# Patient Record
Sex: Male | Born: 1970
Health system: Southern US, Community
[De-identification: ages and names within clinical notes are randomized; demographics above are authoritative.]

## PROBLEM LIST (undated history)

## (undated) ENCOUNTER — Emergency Department (HOSPITAL_COMMUNITY): Discharge status: Absent without leave | Payer: MEDICAID

## (undated) DIAGNOSIS — F329 Major depressive disorder, single episode, unspecified: Secondary | ICD-10-CM

## (undated) DIAGNOSIS — F1010 Alcohol abuse, uncomplicated: Secondary | ICD-10-CM

## (undated) DIAGNOSIS — I10 Essential (primary) hypertension: Secondary | ICD-10-CM

## (undated) DIAGNOSIS — F32A Depression, unspecified: Secondary | ICD-10-CM

## (undated) HISTORY — PX: TRACHEOSTOMY: SUR1362

---

## 2006-04-29 ENCOUNTER — Encounter: Admission: RE | Admit: 2006-04-29 | Discharge: 2006-04-29 | Payer: Self-pay | Admitting: Specialist

## 2008-08-15 ENCOUNTER — Emergency Department (HOSPITAL_COMMUNITY): Admission: EM | Admit: 2008-08-15 | Discharge: 2008-08-15 | Payer: Self-pay | Admitting: Emergency Medicine

## 2016-01-23 ENCOUNTER — Encounter (HOSPITAL_COMMUNITY): Payer: Self-pay

## 2016-01-23 ENCOUNTER — Inpatient Hospital Stay (HOSPITAL_COMMUNITY)
Admission: EM | Admit: 2016-01-23 | Discharge: 2016-01-28 | DRG: 915 | Disposition: A | Discharge status: Home / Self Care | Payer: Self-pay | Attending: Family Medicine | Admitting: Family Medicine

## 2016-01-23 DIAGNOSIS — G9340 Encephalopathy, unspecified: Secondary | ICD-10-CM | POA: Diagnosis present

## 2016-01-23 DIAGNOSIS — T4275XA Adverse effect of unspecified antiepileptic and sedative-hypnotic drugs, initial encounter: Secondary | ICD-10-CM | POA: Diagnosis present

## 2016-01-23 DIAGNOSIS — Z7982 Long term (current) use of aspirin: Secondary | ICD-10-CM

## 2016-01-23 DIAGNOSIS — K5900 Constipation, unspecified: Secondary | ICD-10-CM | POA: Diagnosis not present

## 2016-01-23 DIAGNOSIS — E876 Hypokalemia: Secondary | ICD-10-CM

## 2016-01-23 DIAGNOSIS — Z978 Presence of other specified devices: Secondary | ICD-10-CM

## 2016-01-23 DIAGNOSIS — I10 Essential (primary) hypertension: Secondary | ICD-10-CM | POA: Diagnosis present

## 2016-01-23 DIAGNOSIS — F1020 Alcohol dependence, uncomplicated: Secondary | ICD-10-CM | POA: Diagnosis present

## 2016-01-23 DIAGNOSIS — F17200 Nicotine dependence, unspecified, uncomplicated: Secondary | ICD-10-CM | POA: Diagnosis present

## 2016-01-23 DIAGNOSIS — R339 Retention of urine, unspecified: Secondary | ICD-10-CM | POA: Diagnosis not present

## 2016-01-23 DIAGNOSIS — T464X5A Adverse effect of angiotensin-converting-enzyme inhibitors, initial encounter: Secondary | ICD-10-CM | POA: Diagnosis present

## 2016-01-23 DIAGNOSIS — T783XXA Angioneurotic edema, initial encounter: Principal | ICD-10-CM

## 2016-01-23 DIAGNOSIS — R1310 Dysphagia, unspecified: Secondary | ICD-10-CM | POA: Diagnosis not present

## 2016-01-23 DIAGNOSIS — Z79899 Other long term (current) drug therapy: Secondary | ICD-10-CM

## 2016-01-23 HISTORY — DX: Essential (primary) hypertension: I10

## 2016-01-23 LAB — BASIC METABOLIC PANEL
ANION GAP: 10 (ref 5–15)
BUN: 11 mg/dL (ref 6–20)
CALCIUM: 8.9 mg/dL (ref 8.9–10.3)
CHLORIDE: 109 mmol/L (ref 101–111)
CO2: 24 mmol/L (ref 22–32)
Creatinine, Ser: 1.03 mg/dL (ref 0.61–1.24)
GFR calc non Af Amer: >60 mL/min (ref >60)
Glucose, Bld: 92 mg/dL (ref 65–99)
POTASSIUM: 3.2 mmol/L — AB (ref 3.5–5.1)
Sodium: 143 mmol/L (ref 135–145)

## 2016-01-23 LAB — CBC
HEMATOCRIT: 44.1 % (ref 39.0–52.0)
HEMOGLOBIN: 14.6 g/dL (ref 13.0–17.0)
MCH: 31.2 pg (ref 26.0–34.0)
MCHC: 33.1 g/dL (ref 30.0–36.0)
MCV: 94.2 fL (ref 78.0–100.0)
Platelets: 173 10*3/uL (ref 150–400)
RBC: 4.68 MIL/uL (ref 4.22–5.81)
RDW: 13.1 % (ref 11.5–15.5)
WBC: 8.5 10*3/uL (ref 4.0–10.5)

## 2016-01-23 MED ORDER — DIPHENHYDRAMINE HCL 50 MG/ML IJ SOLN
50.0000 mg | Freq: Once | INTRAMUSCULAR | Status: AC
  Administered 2016-01-23: 50 mg via INTRAVENOUS
  Filled 2016-01-23: qty 1

## 2016-01-23 MED ORDER — LIDOCAINE HCL 2 % EX GEL: 1.0000 "application " | Freq: Once | CUTANEOUS | Status: DC

## 2016-01-23 MED ORDER — FAMOTIDINE IN NACL 20-0.9 MG/50ML-% IV SOLN
20.0000 mg | Freq: Once | INTRAVENOUS | Status: AC
  Administered 2016-01-23: 20 mg via INTRAVENOUS
  Filled 2016-01-23: qty 50

## 2016-01-23 MED ORDER — METHYLPREDNISOLONE SODIUM SUCC 125 MG IJ SOLR
125.0000 mg | Freq: Once | INTRAMUSCULAR | Status: AC
  Administered 2016-01-23: 125 mg via INTRAVENOUS
  Filled 2016-01-23: qty 2

## 2016-01-23 NOTE — ED Notes (Signed)
Pt states he woke up with left sided facial swelling today. Upon arrival pt complaining of "something in my throat" and diffuse upper lip swelling. No audible stridor.

## 2016-01-23 NOTE — ED Provider Notes (Signed)
CSN: [id number]     Arrival date & time 01/23/16  2208 History   First MD Initiated Contact with Patient 01/23/16 2237     Chief Complaint  Patient presents with  . Angioedema     (Consider location/radiation/quality/duration/timing/severity/associated sxs/prior Treatment) HPI   45 year old male with past medical history of hypertension and chronic alcohol dependence who presents with diffuse lip edema as well as tongue swelling and difficulty swallowing for the last several hours. The patient is taking lisinopril but has been taking it for some time. He states that he noticed mild lip edema earlier this afternoon which has progressively worsened. Over the last 3 hours, he has developed worsening difficulty swallowing and feels like he has spit building up in the back of his throat. Denies any shortness of breath with the exception of when he attempts to swallow. Denies any new drugs or allergen exposures. No history of anaphylaxis. Denies any rash, wheezing, nausea, or vomiting.   Past Medical History  Diagnosis Date  . Hypertension    History reviewed. No pertinent past surgical history. History reviewed. No pertinent family history. Social History  Substance Use Topics  . Smoking status: Current Every Day Smoker  . Smokeless tobacco: None  . Alcohol Use: Yes    Review of Systems  Constitutional: Negative for fever, chills and fatigue.  HENT: Positive for drooling, facial swelling, sore throat, trouble swallowing and voice change. Negative for congestion and rhinorrhea.   Eyes: Negative for visual disturbance.  Respiratory: Negative for cough and shortness of breath.   Cardiovascular: Negative for chest pain and leg swelling.  Gastrointestinal: Negative for nausea, vomiting, abdominal pain and diarrhea.  Genitourinary: Negative for dysuria, flank pain and decreased urine volume.  Musculoskeletal: Negative for neck pain and neck stiffness.  Skin: Negative for rash and wound.   Neurological: Negative for syncope, weakness and headaches.      Allergies  Review of patient's allergies indicates no known allergies.  Home Medications   Prior to Admission medications   Medication Sig Start Date End Date Taking? Authorizing Provider  aspirin EC 81 MG tablet Take 81 mg by mouth daily.   Yes Historical Provider, MD  azithromycin (ZITHROMAX) 250 MG tablet Take 250 mg by mouth once.    Historical Provider, MD  diphenhydrAMINE (BENADRYL) 25 MG tablet Take 25 mg by mouth every 6 (six) hours as needed for allergies.   Yes Historical Provider, MD  LISINOPRIL-HYDROCHLOROTHIAZIDE PO Take 1 tablet by mouth daily.   Yes Historical Provider, MD   BP 147/101 mmHg  Pulse 91  Temp(Src) 97.6 F (36.4 C) (Oral)  Resp 14  Ht 5\' 11"  (1.803 m)  Wt 95.511 kg  BMI 29.38 kg/m2  SpO2 100% Physical Exam  Constitutional: He is oriented to person, place, and time. He appears well-developed and well-nourished. He appears distressed.  HENT:  Marked lip edema of upper and lower lips bilaterally as well as surrounding facial edema. Mild fullness of the submandibular space. Tongue with mild swelling but with more severe swelling posteriorly with pooling of secretions.  Eyes: Pupils are equal, round, and reactive to light.  Neck: Normal range of motion. Neck supple.  Cardiovascular: Normal rate, regular rhythm, normal heart sounds and intact distal pulses.  Exam reveals no friction rub.   No murmur heard. Pulmonary/Chest: Breath sounds normal. He is in respiratory distress. He has no wheezes. He has no rales.  Abdominal: Soft. He exhibits no distension. There is no tenderness.  Musculoskeletal: He exhibits no edema.  Neurological: He is alert and oriented to person, place, and time.  Skin: Skin is warm.  Nursing note and vitals reviewed.   ED Course  .Intubation Date/Time: 01/24/2016 1:32 AM Performed by: Shaune Pollack Authorized by: Shaune Pollack Consent: The procedure was  performed in an emergent situation. Verbal consent obtained. Risks and benefits: risks, benefits and alternatives were discussed Consent given by: patient Patient understanding: patient states understanding of the procedure being performed Patient consent: the patient's understanding of the procedure matches consent given Procedure consent: procedure consent matches procedure scheduled Relevant documents: relevant documents present and verified Required items: required blood products, implants, devices, and special equipment available Patient identity confirmed: arm band Time out: Immediately prior to procedure a "time out" was called to verify the correct patient, procedure, equipment, support staff and site/side marked as required. Indications: airway protection Intubation method: video-assisted Patient status: sedated Preoxygenation: BVM Pretreatment medications: lidocaine Sedatives: ketmine Laryngoscope size: Glidescope 4. Tube size: 7.0 mm Tube type: cuffed Number of attempts: 1 Cords visualized: yes Post-procedure assessment: chest rise and ETCO2 monitor Breath sounds: equal Cuff inflated: yes Tube secured with: ETT holder Chest x-ray interpreted by me. Chest x-ray findings: endotracheal tube in appropriate position Patient tolerance: Patient tolerated the procedure well with no immediate complications Comments: Ketamine facilitated intubation as above. Patient topicalized with 4% lidocaine nebulizer, then atomized lidocaine and viscous lidocaine to the tongue and posterior pharynx. Patient then sedated with ketamine 1 mg/kg and awake look performed with 4-0 Glidescope blade. On viewing posterior pharynx, tongue base noted to be markedly edematous with near occlusion of pharynx. Hypopharynx with pooled secretions and moderate edema. Mild/minor edema of epiglottis with no edema of cords.    (including critical care time) Labs Review Labs Reviewed  BASIC METABOLIC PANEL -  Abnormal; Notable for the following:    Potassium 3.2 (*)    All other components within normal limits  CBC  TRIGLYCERIDES  URINALYSIS, ROUTINE W REFLEX MICROSCOPIC (NOT AT [facility])  CBC  BASIC METABOLIC PANEL  BLOOD GAS, ARTERIAL  MAGNESIUM  PHOSPHORUS  URINE RAPID DRUG SCREEN, HOSP PERFORMED    Imaging Review Dg Chest Port 1 View  01/24/2016  CLINICAL DATA:  Post intubation. EXAM: PORTABLE CHEST 1 VIEW COMPARISON:  04/29/2006 FINDINGS: Endotracheal tube 5.5 cm from the carina. Lung volumes are low. Heart size is normal for technique. Crowding of bronchovascular structures likely secondary to low lung volumes. No confluent airspace disease, pleural effusion or pneumothorax. IMPRESSION: 1. Endotracheal tube 5.5 cm from the carina. 2. Low lung volumes with bronchovascular crowding. Electronically Signed   By: Rubye Oaks M.D.   On: 01/24/2016 00:50   I have personally reviewed and evaluated these images and lab results as part of my medical decision-making.   MDM   45 year old African-American male with past medical history of alcohol dependence and hypertension on lisinopril who presents with facial edema and difficulty swallowing with hoarseness. On arrival, patient hypertensive and in mild distress with marked lip edema and moderate posterior pharyngeal swelling. Patient given Solu-Medrol as well as Benadryl and Pepcid en route by EMS. Patient's history and exam is most consistent with ACE inhibitor angioedema versus idiopathic angioedema. He has no other urticaria, wheezing, nausea, vomiting or other symptoms to suggest anaphylaxis and he denies any allergen exposures. Will monitor.  While in ED, patient developed worsening facial swelling, difficulty swallowing, and hoarseness. He feels unable to swallow and is increasingly tachycardic and hypertensive. No stridor noted but he has marked transmitted upper airway  sounds. Given progression of edema/facial swelling, discussed intubation  with patient who is in agreement with this plan. Patient intubated using ketamine-facilitated, awake intubation as above. Patient tolerated procedure well. Propofol, fentanyl given for sedation with ativan for possible withdrawal. ICU contacted. Attempted to contact wife but phone unavailable. Will admit to ICU.  Clinical Impression: 1. Angioedema, initial encounter   2. Hypokalemia     Disposition: Admit  Condition: Stable  Pt seen in conjunction with Dr. Collie Siad, MD 01/24/16 4401  Derwood Kaplan, MD 01/25/16 1302

## 2016-01-24 ENCOUNTER — Emergency Department (HOSPITAL_COMMUNITY): Payer: Self-pay

## 2016-01-24 DIAGNOSIS — J9601 Acute respiratory failure with hypoxia: Secondary | ICD-10-CM

## 2016-01-24 DIAGNOSIS — T783XXA Angioneurotic edema, initial encounter: Principal | ICD-10-CM | POA: Diagnosis present

## 2016-01-24 DIAGNOSIS — E876 Hypokalemia: Secondary | ICD-10-CM | POA: Insufficient documentation

## 2016-01-24 DIAGNOSIS — F1010 Alcohol abuse, uncomplicated: Secondary | ICD-10-CM | POA: Insufficient documentation

## 2016-01-24 DIAGNOSIS — I10 Essential (primary) hypertension: Secondary | ICD-10-CM | POA: Insufficient documentation

## 2016-01-24 LAB — POCT I-STAT 3, ART BLOOD GAS (G3+)
Acid-base deficit: 2 mmol/L (ref 0.0–2.0)
BICARBONATE: 24.5 meq/L — AB (ref 20.0–24.0)
O2 SAT: 99 %
PCO2 ART: 44.9 mmHg (ref 35.0–45.0)
PO2 ART: 152 mmHg — AB (ref 80.0–100.0)
Patient temperature: 98.6
TCO2: 26 mmol/L (ref 0–100)
pH, Arterial: 7.344 — ABNORMAL LOW (ref 7.350–7.450)

## 2016-01-24 LAB — CBC
HCT: 43.8 % (ref 39.0–52.0)
Hemoglobin: 14 g/dL (ref 13.0–17.0)
MCH: 30.2 pg (ref 26.0–34.0)
MCHC: 32 g/dL (ref 30.0–36.0)
MCV: 94.4 fL (ref 78.0–100.0)
PLATELETS: 165 10*3/uL (ref 150–400)
RBC: 4.64 MIL/uL (ref 4.22–5.81)
RDW: 13.2 % (ref 11.5–15.5)
WBC: 6.6 10*3/uL (ref 4.0–10.5)

## 2016-01-24 LAB — URINALYSIS, ROUTINE W REFLEX MICROSCOPIC
BILIRUBIN URINE: NEGATIVE
Glucose, UA: NEGATIVE mg/dL
HGB URINE DIPSTICK: NEGATIVE
KETONES UR: NEGATIVE mg/dL
Leukocytes, UA: NEGATIVE
NITRITE: NEGATIVE
PH: 7.5 (ref 5.0–8.0)
Protein, ur: 100 mg/dL — AB
Specific Gravity, Urine: 1.025 (ref 1.005–1.030)

## 2016-01-24 LAB — MRSA PCR SCREENING: MRSA by PCR: NEGATIVE

## 2016-01-24 LAB — RAPID URINE DRUG SCREEN, HOSP PERFORMED
Amphetamines: NOT DETECTED
BARBITURATES: NOT DETECTED
BENZODIAZEPINES: NOT DETECTED
COCAINE: NOT DETECTED
Opiates: NOT DETECTED
TETRAHYDROCANNABINOL: NOT DETECTED

## 2016-01-24 LAB — I-STAT ARTERIAL BLOOD GAS, ED
ACID-BASE DEFICIT: 2 mmol/L (ref 0.0–2.0)
BICARBONATE: 25 meq/L — AB (ref 20.0–24.0)
O2 SAT: 90 %
PO2 ART: 64 mmHg — AB (ref 80.0–100.0)
TCO2: 26 mmol/L (ref 0–100)
pCO2 arterial: 48.2 mmHg — ABNORMAL HIGH (ref 35.0–45.0)
pH, Arterial: 7.322 — ABNORMAL LOW (ref 7.350–7.450)

## 2016-01-24 LAB — MAGNESIUM: MAGNESIUM: 1.8 mg/dL (ref 1.7–2.4)

## 2016-01-24 LAB — BASIC METABOLIC PANEL
Anion gap: 10 (ref 5–15)
BUN: 12 mg/dL (ref 6–20)
CALCIUM: 8.8 mg/dL — AB (ref 8.9–10.3)
CO2: 24 mmol/L (ref 22–32)
Chloride: 106 mmol/L (ref 101–111)
Creatinine, Ser: 0.87 mg/dL (ref 0.61–1.24)
GFR calc Af Amer: >60 mL/min (ref >60)
GFR calc non Af Amer: >60 mL/min (ref >60)
GLUCOSE: 160 mg/dL — AB (ref 65–99)
POTASSIUM: 4 mmol/L (ref 3.5–5.1)
SODIUM: 140 mmol/L (ref 135–145)

## 2016-01-24 LAB — TRIGLYCERIDES: Triglycerides: 71 mg/dL (ref <150)

## 2016-01-24 LAB — URINE MICROSCOPIC-ADD ON
RBC / HPF: NONE SEEN RBC/hpf (ref 0–5)
WBC, UA: NONE SEEN WBC/hpf (ref 0–5)

## 2016-01-24 LAB — GLUCOSE, CAPILLARY
GLUCOSE-CAPILLARY: 151 mg/dL — AB (ref 65–99)
GLUCOSE-CAPILLARY: 120 mg/dL — AB (ref 65–99)
GLUCOSE-CAPILLARY: 119 mg/dL — AB (ref 65–99)
Glucose-Capillary: 155 mg/dL — ABNORMAL HIGH (ref 65–99)
Glucose-Capillary: 135 mg/dL — ABNORMAL HIGH (ref 65–99)

## 2016-01-24 LAB — PHOSPHORUS: Phosphorus: 4 mg/dL (ref 2.5–4.6)

## 2016-01-24 MED ORDER — DIPHENHYDRAMINE HCL 50 MG/ML IJ SOLN: 50.0000 mg | Freq: Four times a day (QID) | INTRAMUSCULAR | Status: DC | PRN

## 2016-01-24 MED ORDER — HEPARIN SODIUM (PORCINE) 5000 UNIT/ML IJ SOLN
5000.0000 [IU] | Freq: Three times a day (TID) | INTRAMUSCULAR | Status: DC
  Administered 2016-01-24 – 2016-01-28 (×15): 5000 [IU] via SUBCUTANEOUS
  Filled 2016-01-24 (×16): qty 1

## 2016-01-24 MED ORDER — CHLORHEXIDINE GLUCONATE 0.12% ORAL RINSE (MEDLINE KIT)
15.0000 mL | Freq: Two times a day (BID) | OROMUCOSAL | Status: DC
  Administered 2016-01-24 – 2016-01-26 (×5): 15 mL via OROMUCOSAL

## 2016-01-24 MED ORDER — THIAMINE HCL 100 MG/ML IJ SOLN
100.0000 mg | Freq: Every day | INTRAMUSCULAR | Status: DC
  Administered 2016-01-24 – 2016-01-26 (×3): 100 mg via INTRAVENOUS
  Filled 2016-01-24 (×4): qty 1

## 2016-01-24 MED ORDER — METHYLPREDNISOLONE SODIUM SUCC 125 MG IJ SOLR
60.0000 mg | Freq: Four times a day (QID) | INTRAMUSCULAR | Status: DC
  Administered 2016-01-24: 60 mg via INTRAVENOUS
  Filled 2016-01-24: qty 0.96
  Filled 2016-01-24: qty 2

## 2016-01-24 MED ORDER — SODIUM CHLORIDE 0.9 % IV SOLN: 250.0000 mL | INTRAVENOUS | Status: DC | PRN

## 2016-01-24 MED ORDER — PRO-STAT SUGAR FREE PO LIQD
60.0000 mL | Freq: Three times a day (TID) | ORAL | Status: DC
  Administered 2016-01-24 – 2016-01-26 (×6): 60 mL
  Filled 2016-01-24 (×8): qty 60

## 2016-01-24 MED ORDER — FENTANYL CITRATE (PF) 100 MCG/2ML IJ SOLN
100.0000 ug | INTRAMUSCULAR | Status: DC | PRN
  Administered 2016-01-24: 100 ug via INTRAVENOUS
  Filled 2016-01-24: qty 2

## 2016-01-24 MED ORDER — LORAZEPAM 2 MG/ML IJ SOLN
0.0000 mg | Freq: Two times a day (BID) | INTRAMUSCULAR | Status: DC
  Administered 2016-01-26 (×2): 2 mg via INTRAVENOUS
  Filled 2016-01-24 (×2): qty 1

## 2016-01-24 MED ORDER — FENTANYL CITRATE (PF) 100 MCG/2ML IJ SOLN
150.0000 ug | Freq: Once | INTRAMUSCULAR | Status: AC
  Administered 2016-01-24: 150 ug via INTRAVENOUS
  Filled 2016-01-24: qty 4

## 2016-01-24 MED ORDER — PROPOFOL 1000 MG/100ML IV EMUL: 0.0000 ug/kg/min | INTRAVENOUS | Status: DC

## 2016-01-24 MED ORDER — ANTISEPTIC ORAL RINSE SOLUTION (CORINZ)
7.0000 mL | Freq: Four times a day (QID) | OROMUCOSAL | Status: DC
  Administered 2016-01-24 – 2016-01-26 (×10): 7 mL via OROMUCOSAL

## 2016-01-24 MED ORDER — MAGNESIUM SULFATE 2 GM/50ML IV SOLN
2.0000 g | Freq: Once | INTRAVENOUS | Status: AC
  Administered 2016-01-24: 2 g via INTRAVENOUS
  Filled 2016-01-24: qty 50

## 2016-01-24 MED ORDER — DEXAMETHASONE SODIUM PHOSPHATE 4 MG/ML IJ SOLN
6.0000 mg | Freq: Two times a day (BID) | INTRAMUSCULAR | Status: DC
  Administered 2016-01-24 – 2016-01-25 (×4): 6 mg via INTRAVENOUS
  Filled 2016-01-24 (×5): qty 1.5

## 2016-01-24 MED ORDER — SODIUM CHLORIDE 0.9 % IV SOLN
INTRAVENOUS | Status: DC
  Administered 2016-01-24: 100 mL/h via INTRAVENOUS
  Administered 2016-01-24: 03:00:00 via INTRAVENOUS

## 2016-01-24 MED ORDER — SUCCINYLCHOLINE CHLORIDE 20 MG/ML IJ SOLN
150.0000 mg | Freq: Once | INTRAMUSCULAR | Status: AC
  Administered 2016-01-24: 150 mg via INTRAVENOUS
  Filled 2016-01-24: qty 7.5

## 2016-01-24 MED ORDER — LORAZEPAM 2 MG/ML IJ SOLN
2.0000 mg | Freq: Once | INTRAMUSCULAR | Status: AC
  Administered 2016-01-24: 2 mg via INTRAVENOUS
  Filled 2016-01-24: qty 1

## 2016-01-24 MED ORDER — PANTOPRAZOLE SODIUM 40 MG IV SOLR: 40.0000 mg | Freq: Every day | INTRAVENOUS | Status: DC

## 2016-01-24 MED ORDER — KETAMINE HCL-SODIUM CHLORIDE 100-0.9 MG/10ML-% IV SOSY
1.0000 mg/kg | PREFILLED_SYRINGE | Freq: Once | INTRAVENOUS | Status: AC
  Administered 2016-01-24: 96 mg via INTRAVENOUS
  Filled 2016-01-24: qty 10

## 2016-01-24 MED ORDER — FAMOTIDINE IN NACL 20-0.9 MG/50ML-% IV SOLN
20.0000 mg | Freq: Two times a day (BID) | INTRAVENOUS | Status: DC
  Administered 2016-01-24 – 2016-01-26 (×6): 20 mg via INTRAVENOUS
  Filled 2016-01-24 (×7): qty 50

## 2016-01-24 MED ORDER — PROPOFOL 1000 MG/100ML IV EMUL
0.0000 ug/kg/min | INTRAVENOUS | Status: DC
  Administered 2016-01-24 (×2): 50 ug/kg/min via INTRAVENOUS
  Administered 2016-01-24: 5 ug/kg/min via INTRAVENOUS
  Administered 2016-01-24 (×2): 50 ug/kg/min via INTRAVENOUS
  Administered 2016-01-25: 40 ug/kg/min via INTRAVENOUS
  Administered 2016-01-25 – 2016-01-26 (×6): 50 ug/kg/min via INTRAVENOUS
  Filled 2016-01-24 (×15): qty 100

## 2016-01-24 MED ORDER — KETAMINE HCL-SODIUM CHLORIDE 100-0.9 MG/10ML-% IV SOSY
48.0000 mg | PREFILLED_SYRINGE | Freq: Once | INTRAVENOUS | Status: DC
  Filled 2016-01-24: qty 10

## 2016-01-24 MED ORDER — VITAL HIGH PROTEIN PO LIQD
1000.0000 mL | ORAL | Status: DC
  Administered 2016-01-25 – 2016-01-26 (×2): 1000 mL
  Filled 2016-01-24: qty 1000

## 2016-01-24 MED ORDER — PROPOFOL 1000 MG/100ML IV EMUL
INTRAVENOUS | Status: AC
  Filled 2016-01-24: qty 100

## 2016-01-24 MED ORDER — LIDOCAINE HCL 4 % EX SOLN
Freq: Once | CUTANEOUS | Status: AC
  Administered 2016-01-24: 3 mL via TOPICAL

## 2016-01-24 MED ORDER — FENTANYL CITRATE (PF) 100 MCG/2ML IJ SOLN
50.0000 ug | Freq: Once | INTRAMUSCULAR | Status: AC
  Administered 2016-01-24: 50 ug via INTRAVENOUS

## 2016-01-24 MED ORDER — KETAMINE HCL-SODIUM CHLORIDE 100-0.9 MG/10ML-% IV SOSY
0.5000 mg/kg | PREFILLED_SYRINGE | Freq: Once | INTRAVENOUS | Status: AC
Start: 2016-01-24 — End: 2016-01-24
  Administered 2016-01-24: 48 mg via INTRAVENOUS

## 2016-01-24 MED ORDER — POTASSIUM CHLORIDE 20 MEQ/15ML (10%) PO SOLN
40.0000 meq | Freq: Once | ORAL | Status: AC
  Administered 2016-01-24: 40 meq
  Filled 2016-01-24: qty 30

## 2016-01-24 MED ORDER — FOLIC ACID 5 MG/ML IJ SOLN
1.0000 mg | Freq: Once | INTRAMUSCULAR | Status: AC
  Administered 2016-01-24: 1 mg via INTRAVENOUS
  Filled 2016-01-24: qty 0.2

## 2016-01-24 MED ORDER — FOLIC ACID 5 MG/ML IJ SOLN
1.0000 mg | Freq: Every day | INTRAMUSCULAR | Status: DC
  Administered 2016-01-24 – 2016-01-26 (×3): 1 mg via INTRAVENOUS
  Filled 2016-01-24 (×4): qty 0.2

## 2016-01-24 MED ORDER — PRO-STAT SUGAR FREE PO LIQD
30.0000 mL | Freq: Two times a day (BID) | ORAL | Status: DC
  Filled 2016-01-24: qty 30

## 2016-01-24 MED ORDER — SODIUM CHLORIDE 0.9 % IV SOLN
1.0000 mg | Freq: Once | INTRAVENOUS | Status: DC
  Filled 2016-01-24: qty 0.2

## 2016-01-24 MED ORDER — THIAMINE HCL 100 MG/ML IJ SOLN
100.0000 mg | Freq: Once | INTRAMUSCULAR | Status: AC
  Administered 2016-01-24: 100 mg via INTRAVENOUS
  Filled 2016-01-24: qty 2

## 2016-01-24 MED ORDER — FENTANYL BOLUS VIA INFUSION
50.0000 ug | INTRAVENOUS | Status: DC | PRN
  Administered 2016-01-25: 50 ug via INTRAVENOUS
  Filled 2016-01-24: qty 50

## 2016-01-24 MED ORDER — FAMOTIDINE 20 MG PO TABS: 20.0000 mg | ORAL_TABLET | Freq: Two times a day (BID) | ORAL | Status: DC

## 2016-01-24 MED ORDER — LORAZEPAM 2 MG/ML IJ SOLN
0.0000 mg | Freq: Four times a day (QID) | INTRAMUSCULAR | Status: AC
  Administered 2016-01-25: 4 mg via INTRAVENOUS
  Filled 2016-01-24: qty 2

## 2016-01-24 MED ORDER — VITAL HIGH PROTEIN PO LIQD
1000.0000 mL | ORAL | Status: DC
  Administered 2016-01-24: 13:00:00

## 2016-01-24 MED ORDER — FENTANYL CITRATE (PF) 2500 MCG/50ML IJ SOLN
25.0000 ug/h | INTRAMUSCULAR | Status: DC
  Administered 2016-01-24: 25 ug/h via INTRAVENOUS
  Administered 2016-01-25 (×2): 300 ug/h via INTRAVENOUS
  Administered 2016-01-25: 200 ug/h via INTRAVENOUS
  Administered 2016-01-26: 400 ug/h via INTRAVENOUS
  Filled 2016-01-24 (×7): qty 50

## 2016-01-24 MED ORDER — ASPIRIN 81 MG PO CHEW
81.0000 mg | CHEWABLE_TABLET | Freq: Every day | ORAL | Status: DC
  Administered 2016-01-24 – 2016-01-28 (×5): 81 mg
  Filled 2016-01-24 (×5): qty 1

## 2016-01-24 NOTE — Progress Notes (Signed)
Initial Nutrition Assessment  DOCUMENTATION CODES:   Not applicable  INTERVENTION:    Initiate TF via OGT with Vital High Protein at goal rate of 30 ml/h (720 ml per day) and Prostat 60 ml TID to provide 1320 kcals, 153 gm protein, 602 ml free water daily.  Total intake with Propofol and TF will be 2080 kcals.  NUTRITION DIAGNOSIS:   Inadequate oral intake related to inability to eat as evidenced by NPO status.  GOAL:   Patient will meet greater than or equal to 90% of their needs  MONITOR:   Vent status, Labs, Weight trends, TF tolerance, I & O's  REASON FOR ASSESSMENT:   Consult Enteral/tube feeding initiation and management  ASSESSMENT:   45 y.o. male with PMH of HTN and ETOH use. [facility] with lip and tongue swelling along with difficult swallowing due to allergic reaction to Lisinopril.  Patient is currently intubated on ventilator support MV: 8.5 L/min Temp (24hrs), Avg:97.7 F (36.5 C), Min:97.5 F (36.4 C), Max:98.2 F (36.8 C)  Propofol: 28.8 ml/hr providing 760 kcals daily.  Diet Order:  Diet NPO time specified  Skin:  Reviewed, no issues  Last BM:  PTA  Height:   Ht Readings from Last 1 Encounters:  01/24/16 6' (1.829 m)    Weight:   Wt Readings from Last 1 Encounters:  01/24/16 207 lb 14.3 oz (94.3 kg)    Ideal Body Weight:  80.9 kg  BMI:  Body mass index is 28.19 kg/(m^2).  Estimated Nutritional Needs:   Kcal:  1986  Protein:  140-150 gm  Fluid:  >/= 2 L  EDUCATION NEEDS:   No education needs identified at this time  Joaquin CourtsKimberly Harris, RD, LDN, CNSC Pager [phone] After Hours Pager [phone]

## 2016-01-24 NOTE — ED Notes (Signed)
Unable to place OG tube - pt agitated and not tolerating. Dr issacs aware. Sedation ordered.

## 2016-01-24 NOTE — ED Notes (Signed)
The additional Ketamine order was placed to reconcile pyxis count. Initially second dose of Ketamine was removed by other RN via inventory count during emergency situation. Medication was scanned and subsequently pyxis would not allow removal and waste of remaining Ketamine. This Consulting civil engineerCharge RN spoke with pharmacy personnel and this method was recommended to reconcile pyxis count and waste medication.

## 2016-01-24 NOTE — Progress Notes (Signed)
PULMONARY / CRITICAL CARE MEDICINE   Name: Douglas Browning MRN: [id number] DOB: September 10, 1971    ADMISSION DATE:  01/23/2016 CONSULTATION DATE:  01/24/16  REFERRING MD:  EDP  CHIEF COMPLAINT:  Lip / Tongue Swelling  HISTORY OF PRESENT ILLNESS:  Pt is encephelopathic; therefore, this HPI is obtained from chart review. Douglas Browning is a 45 y.o. male with PMH of HTN and ETOH use.  He presented to [facility] ED 04/17 with lip and tongue swelling along with difficult swallowing.  Symptoms had started earlier that afternoon and progressed to the point where he felt that he had saliva pooling in the back of his throat due to difficulty swallowing. He came to ED where he was found to have angioedema, likely due to lisinopril.  He has reportedly been on lisinopril for years without problem, never had similar episode before.  Per ED RN, EDP stated that intubation was straightforward and pt did not have difficult airway.  SUBJECTIVE: On vent, heavily sedated.  VITAL SIGNS: BP 113/83 mmHg  Pulse 66  Temp(Src) 98.2 F (36.8 C) (Oral)  Resp 14  Ht 6' (1.829 m)  Wt 207 lb 14.3 oz (94.3 kg)  BMI 28.19 kg/m2  SpO2 100%  HEMODYNAMICS:    VENTILATOR SETTINGS: Vent Mode:  [-] PRVC FiO2 (%):  [40 %-50 %] 40 % Set Rate:  [14 bmp] 14 bmp Vt Set:  [600 mL] 600 mL PEEP:  [5 cmH20] 5 cmH20 Plateau Pressure:  [16 cmH20-17 cmH20] 16 cmH20  INTAKE / OUTPUT: I/O last 3 completed shifts: In: 831.9 [I.V.:771.9; NG/GT:60] Out: 210 [Urine:210]   PHYSICAL EXAMINATION: General: Young AA male. Neuro: Sedated, slightly agitated. HEENT: Obvious lip, tongue, madibular edema.  PERRL, sclerae anicteric. Cardiovascular: RRR, no M/R/G.  Lungs: Respirations even and unlabored.  CTA bilaterally, No W/R/R. Abdomen: BS x 4, soft, NT/ND.  Musculoskeletal: No gross deformities, no edema.  Skin: Intact, warm, no rashes.  LABS:  BMET  Recent Labs Lab 01/23/16 2255 01/24/16 0330  NA 143 140  K 3.2* 4.0  CL 109  106  CO2 24 24  BUN 11 12  CREATININE 1.03 0.87  GLUCOSE 92 160*    Electrolytes  Recent Labs Lab 01/23/16 2255 01/24/16 0330  CALCIUM 8.9 8.8*  MG  --  1.8  PHOS  --  4.0    CBC  Recent Labs Lab 01/23/16 2255 01/24/16 0330  WBC 8.5 6.6  HGB 14.6 14.0  HCT 44.1 43.8  PLT 173 165    Coag's No results for input(s): APTT, INR in the last 168 hours.  Sepsis Markers No results for input(s): LATICACIDVEN, PROCALCITON, O2SATVEN in the last 168 hours.  ABG  Recent Labs Lab 01/24/16 0218 01/24/16 0419  PHART 7.322* 7.344*  PCO2ART 48.2* 44.9  PO2ART 64.0* 152.0*    Liver Enzymes No results for input(s): AST, ALT, ALKPHOS, BILITOT, ALBUMIN in the last 168 hours.  Cardiac Enzymes No results for input(s): TROPONINI, PROBNP in the last 168 hours.  Glucose  Recent Labs Lab 01/24/16 0333  GLUCAP 151*    Imaging Dg Chest Port 1 View  01/24/2016  CLINICAL DATA:  Post intubation. EXAM: PORTABLE CHEST 1 VIEW COMPARISON:  04/29/2006 FINDINGS: Endotracheal tube 5.5 cm from the carina. Lung volumes are low. Heart size is normal for technique. Crowding of bronchovascular structures likely secondary to low lung volumes. No confluent airspace disease, pleural effusion or pneumothorax. IMPRESSION: 1. Endotracheal tube 5.5 cm from the carina. 2. Low lung volumes with bronchovascular crowding.  Electronically Signed   By: Rubye OaksMelanie  Ehinger M.D.   On: 01/24/2016 00:50    STUDIES:  None.  CULTURES: None.  ANTIBIOTICS: None.  SIGNIFICANT EVENTS: 04/18 > admitted with angioedema presumably from linsinopril, required intubation in ED>  LINES/TUBES: ETT 04/18 >  DISCUSSION: 45 y.o. M who takes lisinopril as outpatient for HTN.  He presented 04/18 with lip and tongue swelling due to angioedema.  He required intubation in the ED.  ASSESSMENT / PLAN:  PULMONARY A: VDRF - due to inability to protect the airway in the setting of angioedema. P:   Full vent  support. Wean as able VAP prevention measures. ABg reviewed, if not oversedated could SBt, no extubation planned Continue benadryl, pepcid x 24 hrs  Consider changing solumedral to decadron  Daily leak start in am   CARDIOVASCULAR A:  Hx HTN. P:  Pt to be instructed to avoid Douglas inhibitors at all costs lifelong. Will need different antihypertensive regimen at home, consider CCB vs ARB. Continue outpatient aspirin.  RENAL A:   Mild hypokalemia resolved P:   NS to kvo Start feeds  GASTROINTESTINAL A:   GI prophylaxis. Nutrition. P:   SUP: Famotidine. Start feeds   HEMATOLOGIC A:   VTE Prophylaxis. P:  SCD's / Heparin. CBC in AM.  INFECTIOUS A:   No indication of infection. P:   Monitor clinically.  ENDOCRINE A:   At risk steroid induced hyperglycemia.   P:   SSI if glucose consistently > 180. steroids  NEUROLOGIC A:   Acute encephalopathy - due to sedation. Hx ETOH use. P:   Sedation:  Propofol gtt / Fentanyl gtt RASS goal: -2 Daily WUA. Thiamine / Folate.  Family updated: None.  Interdisciplinary Family Meeting v Palliative Care Meeting:  Due by: 01/30/16.  Valentino NoseNathan Boswell, MD IMTS PGY-1 [phone]   STAFF NOTE: I, Rory Percyaniel Feinstein, MD FACP have personally reviewed patient's available data, including medical history, events of note, physical examination and test results as part of my evaluation. I have discussed with resident/NP and other care providers such as pharmacist, RN and RRT. In addition, I personally evaluated patient and elicited key findings of: CTA , sedated to rass goal, no edema, pcxr hint edema , maybe head neg pressure edema prior to ett, kvo, start feeds, abg reviewed, keep same MV, SBT if prop to 25 mi if able,. Get leak daily, change steroids to decadron for less mineralocorticoid, supp mag  The patient is critically ill with multiple organ systems failure and requires high complexity decision making for assessment and support,  frequent evaluation and titration of therapies, application of advanced monitoring technologies and extensive interpretation of multiple databases.   Critical Care Time devoted to patient care services described in this note is 50 Minutes. This time reflects time of care of this signee: Rory Percyaniel Feinstein, MD FACP. This critical care time does not reflect procedure time, or teaching time or supervisory time of PA/NP/Med student/Med Resident etc but could involve care discussion time. Rest per NP/medical resident whose note is outlined above and that I agree with   Mcarthur Rossettianiel J. Tyson AliasFeinstein, MD, FACP Pgr: [phone] [facility] 01/24/2016 10:33 AM

## 2016-01-24 NOTE — Progress Notes (Signed)
   01/24/16 1500  Vitals  BP (!) 124/92 mmHg  MAP (mmHg) 102  Pulse Rate 63  ECG Heart Rate 63  Resp 14  Oxygen Therapy  SpO2 100 %  Pre-WUA / WUA Start  [place] Agitation Sedation Scale (RASS) 1  RASS Goal -2  Critical Care Pain Observation Tool (CPOT)  Facial Expression 1  Body Movements 1  Muscle Tension 1  Compliance with ventilator (intubated pts.) 1  Vocalization (extubated pts.) N/A  CPOT Total 4  pt restraints d/ced, due to patient on sedation, while turning pt, pt became agitated with hands moving toward ETT, restraints restarted

## 2016-01-24 NOTE — ED Notes (Signed)
Pt intubated by Dr Erma HeritageIsaacs.

## 2016-01-24 NOTE — ED Notes (Signed)
Dr Rhunette Croftnanavati, Dr Erma HeritageIsaacs, Delorise Jacksonori RN, RT at bedside for intubation.

## 2016-01-24 NOTE — H&P (Signed)
PULMONARY / CRITICAL CARE MEDICINE   Name: Douglas Browning MRN: [id number] DOB: November 07, 1970    ADMISSION DATE:  01/23/2016 CONSULTATION DATE:  01/24/16  REFERRING MD:  EDP  CHIEF COMPLAINT:  Lip / Tongue Swelling  HISTORY OF PRESENT ILLNESS:  Pt is encephelopathic; therefore, this HPI is obtained from chart review. Douglas Browning is a 45 y.o. male with PMH of HTN and ETOH use.  He presented to [facility] ED 04/17 with lip and tongue swelling along with difficult swallowing.  Symptoms had started earlier that afternoon and progressed to the point where he felt that he had saliva pooling in the back of his throat due to difficulty swallowing. He came to ED where he was found to have angioedema, likely due to lisinopril.  He has reportedly been on lisinopril for years without problem, never had similar episode before.  Per ED RN, EDP stated that intubation was straightforward and pt did not have difficult airway.  PAST MEDICAL HISTORY :  He  has a past medical history of Hypertension.  PAST SURGICAL HISTORY: He  has no past surgical history on file.  No Known Allergies  No current facility-administered medications on file prior to encounter.   No current outpatient prescriptions on file prior to encounter.    FAMILY HISTORY:  His has no family status information on file.   SOCIAL HISTORY: He  reports that he has been smoking.  He does not have any smokeless tobacco history on file. He reports that he drinks alcohol. He reports that he does not use illicit drugs.  REVIEW OF SYSTEMS:  Unable to obtain as pt is encephalopathic.  SUBJECTIVE: On vent, slightly agitated.  VITAL SIGNS: BP 154/104 mmHg  Pulse 95  Temp(Src) 97.6 F (36.4 C) (Oral)  Resp 14  Ht  (1.803 m)  Wt 95.511 kg (210 lb 9 oz)  BMI 29.38 kg/m2  SpO2 100%  HEMODYNAMICS:    VENTILATOR SETTINGS: Vent Mode:  [-] PRVC FiO2 (%):  [40 %] 40 % Set Rate:  [14 bmp] 14 bmp Vt Set:  [600 mL] 600 mL PEEP:  [5  cmH20] 5 cmH20 Plateau Pressure:  [17 cmH20] 17 cmH20  INTAKE / OUTPUT:     PHYSICAL EXAMINATION: General: Young AA male, in NAD. Neuro: Sedated, slightly agitated. HEENT: Obvious lip, tongue, madibular edema.  PERRL, sclerae anicteric. Cardiovascular: RRR, no M/R/G.  Lungs: Respirations even and unlabored.  CTA bilaterally, No W/R/R. Abdomen: BS x 4, soft, NT/ND.  Musculoskeletal: No gross deformities, no edema.  Skin: Intact, warm, no rashes.  LABS:  BMET  Recent Labs Lab 01/23/16 2255  NA 143  K 3.2*  CL 109  CO2 24  BUN 11  CREATININE 1.03  GLUCOSE 92    Electrolytes  Recent Labs Lab 01/23/16 2255  CALCIUM 8.9    CBC  Recent Labs Lab 01/23/16 2255  WBC 8.5  HGB 14.6  HCT 44.1  PLT 173    Coag's No results for input(s): APTT, INR in the last 168 hours.  Sepsis Markers No results for input(s): LATICACIDVEN, PROCALCITON, O2SATVEN in the last 168 hours.  ABG No results for input(s): PHART, PCO2ART, PO2ART in the last 168 hours.  Liver Enzymes No results for input(s): AST, ALT, ALKPHOS, BILITOT, ALBUMIN in the last 168 hours.  Cardiac Enzymes No results for input(s): TROPONINI, PROBNP in the last 168 hours.  Glucose No results for input(s): GLUCAP in the last 168 hours.  Imaging Dg Chest [facility] 1 [address]  01/24/2016  CLINICAL DATA:  Post intubation. EXAM: PORTABLE CHEST 1 VIEW COMPARISON:  04/29/2006 FINDINGS: Endotracheal tube 5.5 cm from the carina. Lung volumes are low. Heart size is normal for technique. Crowding of bronchovascular structures likely secondary to low lung volumes. No confluent airspace disease, pleural effusion or pneumothorax. IMPRESSION: 1. Endotracheal tube 5.5 cm from the carina. 2. Low lung volumes with bronchovascular crowding. Electronically Signed   By: Rubye OaksMelanie  Ehinger M.D.   On: 01/24/2016 00:50     STUDIES:  None.  CULTURES: None.  ANTIBIOTICS: None.  SIGNIFICANT EVENTS: 04/18 > admitted with angioedema  presumably from linsinopril, required intubation in ED>  LINES/TUBES: ETT 04/18 >  DISCUSSION: 45 y.o. M who takes lisinopril as outpatient for HTN.  He presented 04/18 with lip and tongue swelling due to angioedema.  He required intubation in the ED.  ASSESSMENT / PLAN:  PULMONARY A: VDRF - due to inability to protect the airway in the setting of angioedema. P:   Full vent support. Wean as able. VAP prevention measures. Hold SBT until angioedema resolved. Continue solumedrol, benadryl, pepcid. CXR in AM.  CARDIOVASCULAR A:  Hx HTN. P:  Pt to be instructed to avoid ACE inhibitors at all costs lifelong. Will need different antihypertensive regimen at home, consider CCB vs ARB. Continue outpatient aspirin. Assess EKG.  RENAL A:   Mild hypokalemia. P:   NS @ 100. Potassium 40 MEq per tube x 1. BMP in AM.  GASTROINTESTINAL A:   GI prophylaxis. Nutrition. P:   SUP: Famotidine. NPO.  HEMATOLOGIC A:   VTE Prophylaxis. P:  SCD's / Heparin. CBC in AM.  INFECTIOUS A:   No indication of infection. P:   Monitor clinically.  ENDOCRINE A:   At risk steroid induced hyperglycemia.   P:   SSI if glucose consistently > 180.  NEUROLOGIC A:   Acute encephalopathy - due to sedation. Hx ETOH use. P:   Sedation:  Propofol gtt / Fentanyl gtt. RASS goal: 0 to -1. Daily WUA. Thiamine / Folate. Assess UDS.  Family updated: None.  Interdisciplinary Family Meeting v Palliative Care Meeting:  Due by: 01/30/16.  CC time: 30 minutes.   Rutherford Guysahul Desai, [place] [facility]onie Dickens- [facility] [place] [facility] Pager: [phone]  or [phone] 01/24/2016, 1:27 AM  Attending Note:  45 year old male with HTN history on lisinopril and etoh abuse history who presents with angioedema.  Patient was intubated in the ED, steroids and H2 blockers started.  Evidently was a difficult airway per EDP.  Continue steroids and pepcid.  Maintain sedated throughout the day given  angioedema.  On exam, angioedema noted and lungs are clear to auscultation.  Maintain sedated for today, thiamine, folate and MVI.  Watch for withdrawal.  The patient is critically ill with multiple organ systems failure and requires high complexity decision making for assessment and support, frequent evaluation and titration of therapies, application of advanced monitoring technologies and extensive interpretation of multiple databases.   Critical Care Time devoted to patient care services described in this note is  35  Minutes. This time reflects time of care of this signee Dr Koren BoundWesam Haleigh Desmith. This critical care time does not reflect procedure time, or teaching time or supervisory time of PA/NP/Med student/Med Resident etc but could involve care discussion time.  Alyson ReedyWesam G. Rita Prom, M.D. [facility]. Pager: [phone]. After hours pager: [phone].

## 2016-01-25 ENCOUNTER — Inpatient Hospital Stay (HOSPITAL_COMMUNITY): Payer: Self-pay

## 2016-01-25 ENCOUNTER — Inpatient Hospital Stay (HOSPITAL_COMMUNITY): Payer: MEDICAID

## 2016-01-25 DIAGNOSIS — Z978 Presence of other specified devices: Secondary | ICD-10-CM | POA: Insufficient documentation

## 2016-01-25 DIAGNOSIS — Z789 Other specified health status: Secondary | ICD-10-CM

## 2016-01-25 LAB — BASIC METABOLIC PANEL
ANION GAP: 11 (ref 5–15)
BUN: 11 mg/dL (ref 6–20)
CO2: 22 mmol/L (ref 22–32)
Calcium: 9 mg/dL (ref 8.9–10.3)
Chloride: 108 mmol/L (ref 101–111)
Creatinine, Ser: 0.84 mg/dL (ref 0.61–1.24)
GFR calc Af Amer: >60 mL/min (ref >60)
Glucose, Bld: 123 mg/dL — ABNORMAL HIGH (ref 65–99)
POTASSIUM: 4.2 mmol/L (ref 3.5–5.1)
SODIUM: 141 mmol/L (ref 135–145)

## 2016-01-25 LAB — GLUCOSE, CAPILLARY
GLUCOSE-CAPILLARY: 116 mg/dL — AB (ref 65–99)
GLUCOSE-CAPILLARY: 127 mg/dL — AB (ref 65–99)
GLUCOSE-CAPILLARY: 128 mg/dL — AB (ref 65–99)
GLUCOSE-CAPILLARY: 135 mg/dL — AB (ref 65–99)
GLUCOSE-CAPILLARY: 147 mg/dL — AB (ref 65–99)
Glucose-Capillary: 138 mg/dL — ABNORMAL HIGH (ref 65–99)

## 2016-01-25 MED ORDER — HYDRALAZINE HCL 20 MG/ML IJ SOLN: 10.0000 mg | INTRAMUSCULAR | Status: DC | PRN

## 2016-01-25 MED ORDER — SODIUM CHLORIDE 0.45 % IV SOLN: INTRAVENOUS | Status: DC

## 2016-01-25 MED ORDER — METOCLOPRAMIDE HCL 5 MG/ML IJ SOLN: 5.0000 mg | Freq: Three times a day (TID) | INTRAMUSCULAR | Status: DC

## 2016-01-25 NOTE — Progress Notes (Signed)
Tube was at 22 cm advanced 4 cm to 26 cm at lip per xray and order from Dr. Bary RichardFienstein.

## 2016-01-25 NOTE — Progress Notes (Signed)
No urine output overnight. Bladder scanned >950 cc , foley cath inserted without difficulty immediately drained 960 cc clear yellow urine.

## 2016-01-25 NOTE — Progress Notes (Signed)
PULMONARY / CRITICAL CARE MEDICINE   Name: Douglas Browning MRN: [id number] DOB: [birth year]    ADMISSION DATE:  01/23/2016 CONSULTATION DATE:  01/24/16  REFERRING MD:  EDP  CHIEF COMPLAINT:  Lip / Tongue Swelling  HISTORY OF PRESENT ILLNESS:  Pt is encephelopathic; therefore, this HPI is obtained from chart review. Douglas Browning is a 45 y.o. male with PMH of HTN and ETOH use.  [facility] with lip and tongue swelling along with difficult swallowing.  Symptoms had started earlier that afternoon and progressed to the point where he felt that he had saliva pooling in the back of his throat due to difficulty swallowing. He came to ED where he was found to have angioedema, likely due to lisinopril.  He has reportedly been on lisinopril for years without problem, never had similar episode before.  Per ED RN, EDP stated that intubation was straightforward and pt did not have difficult airway.  SUBJECTIVE: On vent, heavily sedated, NO leak  VITAL SIGNS: BP 133/93 mmHg  Pulse 66  Temp(Src) 98 F (36.7 C) (Oral)  Resp 14  Ht 6' (1.829 m)  Wt 218 lb 4.1 oz (99 kg)  BMI 29.59 kg/m2  SpO2 99%  HEMODYNAMICS:    VENTILATOR SETTINGS: Vent Mode:  [-] PRVC FiO2 (%):  [30 %-40 %] 30 % Set Rate:  [14 bmp] 14 bmp Vt Set:  [600 mL] 600 mL PEEP:  [5 cmH20] 5 cmH20 Plateau Pressure:  [16 cmH20-19 cmH20] 19 cmH20  INTAKE / OUTPUT: I/O last 3 completed shifts: In: 3189 [I.V.:2562.8; NG/GT:626.3] Out: 2485 [Urine:2485]   PHYSICAL EXAMINATION: General: Young AA male. Neuro: Sedated HEENT: Obvious lip, tongue, madibular edema.  PERRL, sclerae anicteric. Cardiovascular: RRR, no M/R/G.  Lungs: Respirations even and unlabored.  CTA bilaterally, No W/R/R. Abdomen: BS x 4, soft, NT/ND.  Musculoskeletal: No gross deformities, trace pedal edema.  Skin: Intact, warm, no rashes.  LABS:  BMET  Recent Labs Lab 01/23/16 2255 01/24/16 0330 01/25/16 0209  NA 143 140 141  K 3.2*  4.0 4.2  CL 109 106 108  CO2 24 24 22   BUN 11 12 11   CREATININE 1.03 0.87 0.84  GLUCOSE 92 160* 123*    Electrolytes  Recent Labs Lab 01/23/16 2255 01/24/16 0330 01/25/16 0209  CALCIUM 8.9 8.8* 9.0  MG  --  1.8  --   PHOS  --  4.0  --     CBC  Recent Labs Lab 01/23/16 2255 01/24/16 0330  WBC 8.5 6.6  HGB 14.6 14.0  HCT 44.1 43.8  PLT 173 165    Coag's No results for input(s): APTT, INR in the last 168 hours.  Sepsis Markers No results for input(s): LATICACIDVEN, PROCALCITON, O2SATVEN in the last 168 hours.  ABG  Recent Labs Lab 01/24/16 0218 01/24/16 0419  PHART 7.322* 7.344*  PCO2ART 48.2* 44.9  PO2ART 64.0* 152.0*    Liver Enzymes No results for input(s): AST, ALT, ALKPHOS, BILITOT, ALBUMIN in the last 168 hours.  Cardiac Enzymes No results for input(s): TROPONINI, PROBNP in the last 168 hours.  Glucose  Recent Labs Lab 01/24/16 0804 01/24/16 1136 01/24/16 1554 01/24/16 1948 01/25/16 0026 01/25/16 0353  GLUCAP 155* 135* 120* 119* 128* 127*    Imaging Dg Chest Port 1 View  01/25/2016  CLINICAL DATA:  Respiratory failure. EXAM: PORTABLE CHEST 1 VIEW COMPARISON:  January 24, 2016. FINDINGS: Stable cardiomediastinal silhouette. Endotracheal tube is in grossly good position. Interval placement of nasogastric tube  which enters stomach. No pneumothorax or pleural effusion is noted. Mild bibasilar subsegmental atelectasis is noted. Bony thorax is unremarkable. IMPRESSION: Interval placement of nasogastric tube. Mildly increased bibasilar subsegmental atelectasis. Electronically Signed   By: Lupita Raider, M.D.   On: 01/25/2016 07:14   STUDIES:  None.  CULTURES: None.  ANTIBIOTICS: None.  SIGNIFICANT EVENTS: 04/18 > admitted with angioedema presumably from linsinopril, required intubation in ED>  LINES/TUBES: ETT 04/18 >  DISCUSSION: 45 y.o. M who takes lisinopril as outpatient for HTN.  He presented 04/18 with lip and tongue swelling  due to angioedema.  He required intubation in the ED.  ASSESSMENT / PLAN:  PULMONARY A: VDRF - due to inability to protect the airway in the setting of angioedema. High ETT P:   Full vent support. Wean as able, cpap ps,. Goal 2 hr if able VAP prevention measures. ABg reviewed, if not oversedated could SBt, no extubation planned Continue benadryl, pepcid x 24 hrs  Conitnue decadron would NOT change dosing Daily leak  CARDIOVASCULAR A:  Hx HTN. P:  Pt to be instructed to avoid ACE inhibitors at all costs lifelong. Will need different antihypertensive regimen at home, consider CCB vs ARB. Continue outpatient aspirin.  RENAL A:   Mild hypokalemia resolved No urine output after foley removal P:   NS to kvo Replace foley  GASTROINTESTINAL A:   GI prophylaxis. Nutrition P:   SUP: Famotidine. Continue feeds   HEMATOLOGIC A:   VTE Prophylaxis. P:  SCD's / Heparin. Avoid phlebotomy  INFECTIOUS A:   No indication of infection. P:   Monitor clinically.  ENDOCRINE A:   At risk steroid induced hyperglycemia.   P:   SSI if glucose consistently > 180. Steroids remain  NEUROLOGIC A:   Acute encephalopathy - due to sedation. Hx ETOH use. P:   Sedation:  Propofol gtt / Fentanyl gtt RASS goal: -2 Daily WUA. Thiamine / Folate.  Family updated: None.  Interdisciplinary Family Meeting v Palliative Care Meeting:  Due by: 01/30/16.  Valentino Nose, MD IMTS PGY-1 [phone]  STAFF NOTE: I, Rory Percy, MD FACP have personally reviewed patient's available data, including medical history, events of note, physical examination and test results as part of my evaluation. I have discussed with resident/NP and other care providers such as pharmacist, RN and RRT. In addition, I personally evaluated patient and elicited key findings of: lungs clear anterior, ETT HIGH on pcxr, advance ett and repeat pcxr to review, alos no urine, bladder scan noted, replace foley, hope  can reduce sedation to SBT, may consider propofol to precedex  For weaning ability?, leak none , repeat in a, no major improvement face edema, no leak - keep same benadryl, pepcid, decadron for now, may need help with BM in am if none noted The patient is critically ill with multiple organ systems failure and requires high complexity decision making for assessment and support, frequent evaluation and titration of therapies, application of advanced monitoring technologies and extensive interpretation of multiple databases.   Critical Care Time devoted to patient care services described in this note is 30 Minutes. This time reflects time of care of this signee: Rory Percy, MD FACP. This critical care time does not reflect procedure time, or teaching time or supervisory time of PA/NP/Med student/Med Resident etc but could involve care discussion time. Rest per NP/medical resident whose note is outlined above and that I agree with   Mcarthur Rossetti. Tyson Alias, MD, FACP Pgr: [phone] [place] [facility]  01/25/2016 8:59 AM

## 2016-01-26 LAB — BASIC METABOLIC PANEL
Anion gap: 11 (ref 5–15)
BUN: 18 mg/dL (ref 6–20)
CHLORIDE: 110 mmol/L (ref 101–111)
CO2: 23 mmol/L (ref 22–32)
CREATININE: 0.89 mg/dL (ref 0.61–1.24)
Calcium: 8.8 mg/dL — ABNORMAL LOW (ref 8.9–10.3)
GFR calc non Af Amer: >60 mL/min (ref >60)
GLUCOSE: 135 mg/dL — AB (ref 65–99)
Potassium: 4 mmol/L (ref 3.5–5.1)
Sodium: 144 mmol/L (ref 135–145)

## 2016-01-26 LAB — TRIGLYCERIDES: TRIGLYCERIDES: 125 mg/dL (ref <150)

## 2016-01-26 LAB — GLUCOSE, CAPILLARY
GLUCOSE-CAPILLARY: 105 mg/dL — AB (ref 65–99)
GLUCOSE-CAPILLARY: 110 mg/dL — AB (ref 65–99)
GLUCOSE-CAPILLARY: 116 mg/dL — AB (ref 65–99)
GLUCOSE-CAPILLARY: 125 mg/dL — AB (ref 65–99)
GLUCOSE-CAPILLARY: 99 mg/dL (ref 65–99)
Glucose-Capillary: 123 mg/dL — ABNORMAL HIGH (ref 65–99)
Glucose-Capillary: 89 mg/dL (ref 65–99)

## 2016-01-26 MED ORDER — WHITE PETROLATUM GEL
Status: AC
  Administered 2016-01-26: 0.2
  Filled 2016-01-26: qty 1

## 2016-01-26 MED ORDER — HYDRALAZINE HCL 20 MG/ML IJ SOLN
10.0000 mg | Freq: Once | INTRAMUSCULAR | Status: AC
  Administered 2016-01-26: 10 mg via INTRAVENOUS
  Filled 2016-01-26: qty 1

## 2016-01-26 MED ORDER — LABETALOL HCL 5 MG/ML IV SOLN
20.0000 mg | INTRAVENOUS | Status: DC | PRN
  Administered 2016-01-26: 20 mg via INTRAVENOUS
  Filled 2016-01-26: qty 4

## 2016-01-26 MED ORDER — HYDRALAZINE HCL 20 MG/ML IJ SOLN
20.0000 mg | Freq: Once | INTRAMUSCULAR | Status: AC
  Administered 2016-01-26: 20 mg via INTRAVENOUS
  Filled 2016-01-26: qty 1

## 2016-01-26 MED ORDER — LABETALOL HCL 5 MG/ML IV SOLN
INTRAVENOUS | Status: AC
  Administered 2016-01-26 (×2): 10 mg
  Filled 2016-01-26: qty 4

## 2016-01-26 MED ORDER — LABETALOL HCL 5 MG/ML IV SOLN: 10.0000 mg | Freq: Once | INTRAVENOUS | Status: AC

## 2016-01-26 MED ORDER — CETYLPYRIDINIUM CHLORIDE 0.05 % MT LIQD
7.0000 mL | Freq: Two times a day (BID) | OROMUCOSAL | Status: DC
  Administered 2016-01-26: 7 mL via OROMUCOSAL

## 2016-01-26 MED ORDER — MIDAZOLAM HCL 2 MG/2ML IJ SOLN: 1.0000 mg | INTRAMUSCULAR | Status: DC | PRN

## 2016-01-26 MED ORDER — DEXAMETHASONE SODIUM PHOSPHATE 4 MG/ML IJ SOLN
4.0000 mg | Freq: Two times a day (BID) | INTRAMUSCULAR | Status: DC
  Administered 2016-01-26 (×2): 4 mg via INTRAVENOUS
  Filled 2016-01-26 (×3): qty 1

## 2016-01-26 MED ORDER — CLONIDINE HCL 0.1 MG/24HR TD PTWK
0.1000 mg | MEDICATED_PATCH | TRANSDERMAL | Status: DC
  Administered 2016-01-26: 0.1 mg via TRANSDERMAL
  Filled 2016-01-26: qty 1

## 2016-01-26 MED ORDER — DEXMEDETOMIDINE HCL IN NACL 200 MCG/50ML IV SOLN
0.0000 ug/kg/h | INTRAVENOUS | Status: DC
  Filled 2016-01-26: qty 50

## 2016-01-26 MED ORDER — POLYETHYLENE GLYCOL 3350 17 G PO PACK
17.0000 g | PACK | Freq: Once | ORAL | Status: AC
  Administered 2016-01-26: 17 g via ORAL
  Filled 2016-01-26: qty 1

## 2016-01-26 MED ORDER — CHLORHEXIDINE GLUCONATE 0.12 % MT SOLN
15.0000 mL | Freq: Two times a day (BID) | OROMUCOSAL | Status: DC
  Administered 2016-01-26: 15 mL via OROMUCOSAL

## 2016-01-26 MED ORDER — LABETALOL HCL 5 MG/ML IV SOLN
20.0000 mg | INTRAVENOUS | Status: DC | PRN
  Administered 2016-01-26 – 2016-01-27 (×3): 20 mg via INTRAVENOUS
  Filled 2016-01-26 (×4): qty 4

## 2016-01-26 MED ORDER — PROPOFOL 1000 MG/100ML IV EMUL: 0.0000 ug/kg/min | INTRAVENOUS | Status: DC

## 2016-01-26 MED ORDER — CLONIDINE HCL 0.2 MG/24HR TD PTWK
0.2000 mg | MEDICATED_PATCH | TRANSDERMAL | Status: DC
  Administered 2016-01-27: 0.2 mg via TRANSDERMAL
  Filled 2016-01-26: qty 1

## 2016-01-26 NOTE — Progress Notes (Signed)
PULMONARY / CRITICAL CARE MEDICINE   Name: Douglas Browning MRN: [id number] DOB: 12/05/1970    ADMISSION DATE:  01/23/2016 CONSULTATION DATE:  01/24/16  REFERRING MD:  EDP  CHIEF COMPLAINT:  Lip / Tongue Swelling  HISTORY OF PRESENT ILLNESS:  Pt is encephelopathic; therefore, this HPI is obtained from chart review. Douglas Browning is a 45 y.o. male with PMH of HTN and ETOH use.  [facility] with lip and tongue swelling along with difficult swallowing.  Symptoms had started earlier that afternoon and progressed to the point where he felt that he had saliva pooling in the back of his throat due to difficulty swallowing. He came to ED where he was found to have angioedema, likely due to lisinopril.  He has reportedly been on lisinopril for years without problem, never had similar episode before.  Per ED RN, EDP stated that intubation was straightforward and pt did not have difficult airway.  SUBJECTIVE: On vent, heavily sedated, small leak this am. Agitated, tachycardic overnight, CIWA 13 - got Ativian.   VITAL SIGNS: BP 137/94 mmHg  Pulse 79  Temp(Src) 99.1 F (37.3 C) (Oral)  Resp 14  Ht 6' (1.829 m)  Wt 218 lb 4.1 oz (99 kg)  BMI 29.59 kg/m2  SpO2 97%  HEMODYNAMICS:    VENTILATOR SETTINGS: Vent Mode:  [-] PRVC FiO2 (%):  [30 %] 30 % Set Rate:  [14 bmp] 14 bmp Vt Set:  [600 mL] 600 mL PEEP:  [5 cmH20] 5 cmH20 Plateau Pressure:  [16 cmH20-19 cmH20] 19 cmH20  INTAKE / OUTPUT: I/O last 3 completed shifts: In: 3471.5 [I.V.:2590.2; NG/GT:881.3] Out: 4210 [Urine:4210]   PHYSICAL EXAMINATION: General: Young AA male. Neuro: Sedated HEENT: Obvious lip, tongue, madibular edema - improving.  PERRL, sclerae anicteric. Cardiovascular: RRR, no M/R/G.  Lungs: Respirations even and unlabored.  CTA bilaterally, No W/R/R. Abdomen: BS x 4, soft, NT/ND.  Musculoskeletal: No gross deformities, trace pedal edema.  Skin: Intact, warm, no rashes.  LABS:  BMET  Recent  Labs Lab 01/24/16 0330 01/25/16 0209 01/26/16 0249  NA 140 141 144  K 4.0 4.2 4.0  CL 106 108 110  CO2 24 22 23   BUN 12 11 18   CREATININE 0.87 0.84 0.89  GLUCOSE 160* 123* 135*    Electrolytes  Recent Labs Lab 01/24/16 0330 01/25/16 0209 01/26/16 0249  CALCIUM 8.8* 9.0 8.8*  MG 1.8  --   --   PHOS 4.0  --   --     CBC  Recent Labs Lab 01/23/16 2255 01/24/16 0330  WBC 8.5 6.6  HGB 14.6 14.0  HCT 44.1 43.8  PLT 173 165    Coag's No results for input(s): APTT, INR in the last 168 hours.  Sepsis Markers No results for input(s): LATICACIDVEN, PROCALCITON, O2SATVEN in the last 168 hours.  ABG  Recent Labs Lab 01/24/16 0218 01/24/16 0419  PHART 7.322* 7.344*  PCO2ART 48.2* 44.9  PO2ART 64.0* 152.0*    Liver Enzymes No results for input(s): AST, ALT, ALKPHOS, BILITOT, ALBUMIN in the last 168 hours.  Cardiac Enzymes No results for input(s): TROPONINI, PROBNP in the last 168 hours.  Glucose  Recent Labs Lab 01/25/16 0353 01/25/16 0758 01/25/16 1132 01/25/16 1526 01/25/16 1942 01/26/16 0003  GLUCAP 127* 147* 116* 135* 138* 123*    Imaging Dg Chest Port 1 View  01/25/2016  CLINICAL DATA:  Endotracheal tube assessment EXAM: PORTABLE CHEST 1 VIEW COMPARISON:  01/25/2016 FINDINGS: Endotracheal tube positioned 4  cm from carina. NG tube extends to stomach. Normal cardiac silhouette. No effusion, infiltrate, or pneumothorax. IMPRESSION: Endotracheal tube in good position. Electronically Signed   By: Genevive Bi M.D.   On: 01/25/2016 10:26   STUDIES:  None.  CULTURES: None.  ANTIBIOTICS: None.  SIGNIFICANT EVENTS: 04/18 > admitted with angioedema presumably from linsinopril, required intubation in ED>  LINES/TUBES: ETT 04/18 >  DISCUSSION: 45 y.o. M who takes lisinopril as outpatient for HTN.  He presented 04/18 with lip and tongue swelling due to angioedema.  He required intubation in the ED.  ASSESSMENT / PLAN:  PULMONARY A: VDRF  - due to inability to protect the airway in the setting of angioedema. P:   Full vent support. SBT today Small cuff leak this am - daily leak VAP prevention measures. Dc benadryl pepcid x 24 hrs  Conitnue decadron but reduce  CARDIOVASCULAR A:  Hx HTN. P:  Pt to be instructed to avoid ACE inhibitors at all costs lifelong. Will need different antihypertensive regimen at home, consider CCB vs Diuretic  Continue outpatient aspirin.  RENAL A:   Mild hypokalemia resolved Urinary retention  P:   NS to kvo D/C foley when extubated  GASTROINTESTINAL A:   GI prophylaxis. Nutrition Constipation P:   SUP: Famotidine. Continue feeds, hold if weaning well Start miralax   HEMATOLOGIC A:   VTE Prophylaxis. P:  SCD's / Heparin. Avoid phlebotomy daily as able  INFECTIOUS A:   No indication of infection. P:   Monitor clinically.  ENDOCRINE A:   At risk steroid induced hyperglycemia.   P:   SSI if glucose consistently > 180. Steroids remain and reducing  NEUROLOGIC A:   Acute encephalopathy - due to sedation. Hx ETOH use. P:   Sedation:  On Propofol gtt / Fentanyl gtt D/C propofol, change to precedex RASS goal: -2 Daily WUA. Thiamine / Folate.  Family updated: None.  Interdisciplinary Family Meeting v Palliative Care Meeting:  Due by: 01/30/16.  Valentino Nose, MD IMTS PGY-1 [phone]  STAFF NOTE: I, Rory Percy, MD FACP have personally reviewed patient's available data, including medical history, events of note, physical examination and test results as part of my evaluation. I have discussed with resident/NP and other care providers such as pharmacist, RN and RRT. In addition, I personally evaluated patient and elicited key findings of: rass -2, not following commands yet, weaning this am cpap 5 ps 5 goal 30 in with wua, mandatory wua , need rapid reduction fent, prop is likley the most effective short acting agent for this pt to get extubated today,  even baalnce goals, leak noted, lips and face swollen improved, no role chem in am , continued sub q hep, if extubated will need SLP, main issue is dc sedation The patient is critically ill with multiple organ systems failure and requires high complexity decision making for assessment and support, frequent evaluation and titration of therapies, application of advanced monitoring technologies and extensive interpretation of multiple databases.   Critical Care Time devoted to patient care services described in this note is 30 Minutes. This time reflects time of care of this signee: Rory Percy, MD FACP. This critical care time does not reflect procedure time, or teaching time or supervisory time of PA/NP/Med student/Med Resident etc but could involve care discussion time. Rest per NP/medical resident whose note is outlined above and that I agree with   Mcarthur Rossetti. Tyson Alias, MD, FACP Pgr: [phone] [place] [facility] 01/26/2016 8:44 AM

## 2016-01-26 NOTE — Procedures (Signed)
Extubation Procedure Note  Patient Details:   Name: Douglas Browning DOB: 1971-05-08 MRN: [id number]   Airway Documentation: Pt had audible cuff leak and following commands prior to extubation.  Placed on 35% AFM sat 99%.  Able to speak and knows where he is. Has a strong productive cough and will continue to monitor patient.    Evaluation  O2 sats: stable throughout Complications: No apparent complications Patient did tolerate procedure well. Bilateral Breath Sounds: Clear, Diminished   Yes  Richmond CampbellHall, [name] 01/26/2016, 11:47 AM

## 2016-01-26 NOTE — Progress Notes (Signed)
Pt's SBP remains > 150 mmHg despite doses of labetalol and catepres patch; Dr Mikey BussingHoffman notified;  See orders; will  continue to monitor patient.  Burnard BuntingKatrice Jacobe Study, RN

## 2016-01-27 LAB — GLUCOSE, CAPILLARY
Glucose-Capillary: 119 mg/dL — ABNORMAL HIGH (ref 65–99)
Glucose-Capillary: 89 mg/dL (ref 65–99)
Glucose-Capillary: 91 mg/dL (ref 65–99)

## 2016-01-27 MED ORDER — FENTANYL CITRATE (PF) 100 MCG/2ML IJ SOLN
12.5000 ug | INTRAMUSCULAR | Status: DC | PRN
  Administered 2016-01-27: 12.5 ug via INTRAVENOUS
  Filled 2016-01-27 (×2): qty 2

## 2016-01-27 MED ORDER — CLONIDINE HCL 0.1 MG/24HR TD PTWK: 0.1000 mg | MEDICATED_PATCH | TRANSDERMAL | Status: DC

## 2016-01-27 MED ORDER — PHENOL 1.4 % MT LIQD
1.0000 | OROMUCOSAL | Status: DC | PRN
  Administered 2016-01-27: 1 via OROMUCOSAL
  Filled 2016-01-27: qty 177

## 2016-01-27 MED ORDER — LABETALOL HCL 100 MG PO TABS
200.0000 mg | ORAL_TABLET | Freq: Two times a day (BID) | ORAL | Status: DC
  Administered 2016-01-27 – 2016-01-28 (×3): 200 mg via ORAL
  Filled 2016-01-27: qty 2
  Filled 2016-01-27: qty 1
  Filled 2016-01-27: qty 2

## 2016-01-27 MED ORDER — LORAZEPAM 1 MG PO TABS: 1.0000 mg | ORAL_TABLET | Freq: Three times a day (TID) | ORAL | Status: DC | PRN

## 2016-01-27 MED ORDER — FOLIC ACID 1 MG PO TABS
1.0000 mg | ORAL_TABLET | Freq: Every day | ORAL | Status: DC
  Administered 2016-01-27 – 2016-01-28 (×2): 1 mg via ORAL
  Filled 2016-01-27 (×2): qty 1

## 2016-01-27 MED ORDER — CLONIDINE HCL 0.3 MG/24HR TD PTWK: 0.3000 mg | MEDICATED_PATCH | TRANSDERMAL | Status: DC

## 2016-01-27 MED ORDER — VITAMIN B-1 100 MG PO TABS
100.0000 mg | ORAL_TABLET | Freq: Every day | ORAL | Status: DC
  Administered 2016-01-27 – 2016-01-28 (×2): 100 mg via ORAL
  Filled 2016-01-27 (×2): qty 1

## 2016-01-27 MED ORDER — HYDROCHLOROTHIAZIDE 25 MG PO TABS
25.0000 mg | ORAL_TABLET | Freq: Every day | ORAL | Status: DC
  Administered 2016-01-27 – 2016-01-28 (×2): 25 mg via ORAL
  Filled 2016-01-27 (×2): qty 1

## 2016-01-27 MED ORDER — HYDRALAZINE HCL 20 MG/ML IJ SOLN
10.0000 mg | INTRAMUSCULAR | Status: DC | PRN
  Administered 2016-01-27: 10 mg via INTRAVENOUS
  Filled 2016-01-27: qty 1

## 2016-01-27 NOTE — Evaluation (Signed)
Clinical/Bedside Swallow Evaluation Patient Details  Name: Douglas Browning MRN: [id number] Date of Birth: 07-22-1971  Today's Date: 01/27/2016 Time: SLP Start Time (ACUTE ONLY): [id number] SLP Stop Time (ACUTE ONLY): 0939 SLP Time Calculation (min) (ACUTE ONLY): 14 min  Past Medical History:  Past Medical History  Diagnosis Date  . Hypertension    Past Surgical History: History reviewed. No pertinent past surgical history. HPI:  45 y.o. M who takes lisinopril as outpatient for HTN. He presented 04/18 with lip and tongue swelling and difficulty swallowing due to angioedema. He required intubation 4/18-4/20.   Assessment / Plan / Recommendation Clinical Impression  Pt has subjective c/o odynophagia and has multiple swallows with more solid textures. Immediate throat clearing noted x1 after soft solids, and additionally after several consecutive straw sips of thin liquid. Recommend to initiate Dys 2 diet and thin liquids by cup. SLP to follow for tolerance and readiness to advance.    Aspiration Risk  Mild aspiration risk;Moderate aspiration risk    Diet Recommendation Dysphagia 2 (Fine chop);Thin liquid   Liquid Administration via: Cup;No straw Medication Administration: Whole meds with puree Supervision: Patient able to self feed;Full supervision/cueing for compensatory strategies Compensations: Slow rate;Small sips/bites Postural Changes: Seated upright at 90 degrees    Other  Recommendations Oral Care Recommendations: Oral care BID   Follow up Recommendations   (tba)    Frequency and Duration min 2x/week  2 weeks       Prognosis Prognosis for Safe Diet Advancement: Good      Swallow Study   General Date of Onset: 01/24/16 HPI: 45 y.o. M who takes lisinopril as outpatient for HTN. He presented 04/18 with lip and tongue swelling and difficulty swallowing due to angioedema. He required intubation 4/18-4/20. Type of Study: Bedside Swallow Evaluation Previous Swallow  Assessment: none in chart Diet Prior to this Study: NPO Temperature Spikes Noted: No Respiratory Status: Nasal cannula History of Recent Intubation: Yes Length of Intubations (days): 3 days Date extubated: 01/26/16 Behavior/Cognition: Alert;Cooperative;Pleasant mood Oral Cavity Assessment: Within Functional Limits Oral Care Completed by SLP: No Oral Cavity - Dentition: Adequate natural dentition Vision: Functional for self-feeding Self-Feeding Abilities: Able to feed self;Needs set up Patient Positioning: Upright in bed Baseline Vocal Quality: Low vocal intensity Volitional Cough: Strong Volitional Swallow: Able to elicit    Oral/Motor/Sensory Function Overall Oral Motor/Sensory Function: Within functional limits   Ice Chips Ice chips: Impaired Presentation: Spoon Pharyngeal Phase Impairments: Other (comments) (pt says it is "too big" to swallow, but does swallow)   Thin Liquid Thin Liquid: Impaired Presentation: Cup;Self Fed;Straw Pharyngeal  Phase Impairments: Suspected delayed Swallow;Throat Clearing - Immediate    Nectar Thick Nectar Thick Liquid: Not tested   Honey Thick Honey Thick Liquid: Not tested   Puree Puree: Within functional limits Presentation: Self Fed;Spoon   Solid   GO   Solid: Impaired Presentation: Self Fed Pharyngeal Phase Impairments: Throat Clearing - Immediate;Multiple swallows       Douglas Browning, M.A. CCC-SLP [phone]  Douglas Browning, Douglas Browning 01/27/2016,10:22 AM

## 2016-01-27 NOTE — Progress Notes (Signed)
Pt SBP continue to run 180-190's. Informed Dr. Renetta ChalkWallace Hydalazine given x2 and clonidine patch increased 0.2mg  per MD order. SBP now 150's.

## 2016-01-27 NOTE — Progress Notes (Addendum)
[facility] to 6N15.

## 2016-01-27 NOTE — Progress Notes (Signed)
PULMONARY / CRITICAL CARE MEDICINE   Name: Douglas Browning MRN: [id number] DOB: 1971/04/07    ADMISSION DATE:  01/23/2016 CONSULTATION DATE:  01/24/16  REFERRING MD:  EDP  CHIEF COMPLAINT:  Lip / Tongue Swelling  HISTORY OF PRESENT ILLNESS:  Pt is encephelopathic; therefore, this HPI is obtained from chart review. Douglas Browning is a 45 y.o. male with PMH of HTN and ETOH use.  [facility]17 with lip and tongue swelling along with difficult swallowing.  Symptoms had started earlier that afternoon and progressed to the point where he felt that he had saliva pooling in the back of his throat due to difficulty swallowing. He came to ED where he was found to have angioedema, likely due to lisinopril.  He has reportedly been on lisinopril for years without problem, never had similar episode before.  Per ED RN, EDP stated that intubation was straightforward and pt did not have difficult airway.  SUBJECTIVE: Alert and awake this morning. Complaining of some pain and difficulty with swallowing. Denies any headaches/vision changes. Reports drinking 1 pint of liquor 3-4 times a week. Last drink 4-5 days ago.   VITAL SIGNS: BP 158/102 mmHg  Pulse 79  Temp(Src) 99.2 F (37.3 C) (Oral)  Resp 18  Ht 6' (1.829 m)  Wt 201 lb 11.5 oz (91.5 kg)  BMI 27.35 kg/m2  SpO2 96%  HEMODYNAMICS:    VENTILATOR SETTINGS: Vent Mode:  [-] PSV;CPAP FiO2 (%):  [30 %] 30 % PEEP:  [5 cmH20] 5 cmH20 Pressure Support:  [5 cmH20] 5 cmH20  INTAKE / OUTPUT: I/O last 3 completed shifts: In: 2152.6 [I.V.:1162.6; NG/GT:840; IV Piggyback:150] Out: 2795 [Urine:2795]   PHYSICAL EXAMINATION: General: Young AA male. Neuro: AAOx4, no focal deficits HEENT: Obvious lip, tongue, madibular edema - improving.  PERRL, sclerae anicteric. Cardiovascular: RRR, no M/R/G.  Lungs: Respirations even and unlabored.  CTA bilaterally, No W/R/R. Abdomen: BS x 4, soft, NT/ND.  Musculoskeletal: No gross deformities,  trace pedal edema.  Skin: Intact, warm, no rashes.  LABS:  BMET  Recent Labs Lab 01/24/16 0330 01/25/16 0209 01/26/16 0249  NA 140 141 144  K 4.0 4.2 4.0  CL 106 108 110  CO2 24 22 23   BUN 12 11 18   CREATININE 0.87 0.84 0.89  GLUCOSE 160* 123* 135*    Electrolytes  Recent Labs Lab 01/24/16 0330 01/25/16 0209 01/26/16 0249  CALCIUM 8.8* 9.0 8.8*  MG 1.8  --   --   PHOS 4.0  --   --     CBC  Recent Labs Lab 01/23/16 2255 01/24/16 0330  WBC 8.5 6.6  HGB 14.6 14.0  HCT 44.1 43.8  PLT 173 165    Coag's No results for input(s): APTT, INR in the last 168 hours.  Sepsis Markers No results for input(s): LATICACIDVEN, PROCALCITON, O2SATVEN in the last 168 hours.  ABG  Recent Labs Lab 01/24/16 0218 01/24/16 0419  PHART 7.322* 7.344*  PCO2ART 48.2* 44.9  PO2ART 64.0* 152.0*    Liver Enzymes No results for input(s): AST, ALT, ALKPHOS, BILITOT, ALBUMIN in the last 168 hours.  Cardiac Enzymes No results for input(s): TROPONINI, PROBNP in the last 168 hours.  Glucose  Recent Labs Lab 01/26/16 0740 01/26/16 1119 01/26/16 1530 01/26/16 1938 01/26/16 2337 01/27/16 0341  GLUCAP 105* 116* 99 89 125* 119*    Imaging No results found. STUDIES:  None.  CULTURES: None.  ANTIBIOTICS: None.  SIGNIFICANT EVENTS: 04/18 > admitted with angioedema presumably  from linsinopril, required intubation in ED> 4/20 extubated  LINES/TUBES: ETT 04/18 >4/20  DISCUSSION: 45 y.o. M who takes lisinopril as outpatient for HTN.  He presented 04/18 with lip and tongue swelling due to angioedema.  He required intubation in the ED.  ASSESSMENT / PLAN:  PULMONARY A: VDRF - due to inability to protect the airway in the setting of angioedema. P:   Extubated yesterday pepcid x 24 hrs , dc Conitnue decadron but reduce to off IS Ambulating and check sats   CARDIOVASCULAR A:  Hx HTN P:  Pt to be instructed to avoid ACE inhibitors at all costs  lifelong. Will need different antihypertensive regimen at home, consider CCB vs Diuretic  Continue outpatient aspirin. Started labetalol 20 IV prn and Hydral 10 IV prn - remain hypertensive SBP 160s Swallow eval pending Increase clon patch as unable for oral meds yet  RENAL A:   Mild hypokalemia resolved Urinary retention  P:   NS to kvo D/C foley   GASTROINTESTINAL A:   GI prophylaxis. Nutrition Constipation P:   SUP: Famotidine - dc Start miralax  SLP  HEMATOLOGIC A:   VTE Prophylaxis. P:  SCD's / Heparin, keep until ambualtion Avoid phlebotomy daily as able  INFECTIOUS A:   No indication of infection. P:   Monitor clinically.  ENDOCRINE A:   At risk steroid induced hyperglycemia.   P:   SSI if glucose consistently > 180. Steroids remain and reducing  NEUROLOGIC A:   Acute encephalopathy - due to sedation. Hx ETOH use. P:   Mental status improved Continue Thiamine / Folate.  Family updated: None.  Interdisciplinary Family Meeting v Palliative Care Meeting:  Due by: 01/30/16.  Valentino Nose, MD IMTS PGY-1 [phone]  STAFF NOTE: I, Rory Percy, MD FACP have personally reviewed patient's available data, including medical history, events of note, physical examination and test results as part of my evaluation. I have discussed with resident/NP and other care providers such as pharmacist, RN and RRT. In addition, I personally evaluated patient and elicited key findings of: awake in bed, no distress, but has symptoms of swallowing / pain - need slp, unable to treat HTN oral as of yet, increase clon patch, labetolol may need higher dosing, attempt hydral, once able then to oral labeo, and clon for etoh wd?, once beter control, would NOT send home on clo, consider hctz also, check pulse ox ambulation then dc sub q hep, to traid, med floor, dc roids, dc pepcid   Mcarthur Rossetti. Tyson Alias, MD, FACP Pgr: [phone] [facility] 01/27/2016  10:56 AM

## 2016-01-27 NOTE — Progress Notes (Addendum)
NURSING PROGRESS NOTE  Tobin ChadGabiel B Vora [id number] Transfer Data: 01/27/2016 1:23 PM Attending Provider: Alyson ReedyWesam G Yacoub, MD PCP:No PCP Per Patient Code Status: FULL  Tobin ChadGabiel B Dinkel is a 45 y.o. male patient transferred from 25M -No acute distress noted.  -No complaints of shortness of breath.  -No complaints of chest pain.    Patient complaining of a mild headache and asking for medication. No PRNs ordered. MD paged.  Verbal orders received from 12.5mg  fentanyl IV q2 PRN   Blood pressure 176/101, pulse 76, temperature 99.2 F (37.3 C), temperature source Oral, resp. rate 19, height 6' (1.829 m), weight 91.5 kg (201 lb 11.5 oz), SpO2 97 %.  Allergies:  Lisinopril  Past Medical History:   has a past medical history of Hypertension.  Past Surgical History:   has no past surgical history on file.  Social History:   reports that he has been smoking.  He does not have any smokeless tobacco history on file. He reports that he drinks alcohol. He reports that he does not use illicit drugs.  Skin: intact  Patient/Family orientated to room. Information packet given to patient/family. Admission inpatient armband information verified with patient/family to include name and date of birth and placed on patient arm. Side rails up x 2, fall assessment and education completed with patient/family. Patient/family able to verbalize understanding of risk associated with falls and verbalized understanding to call for assistance before getting out of bed. Call light within reach. Patient/family able to voice and demonstrate understanding of unit orientation instructions.    Will continue to evaluate and treat per MD orders.

## 2016-01-27 NOTE — Progress Notes (Signed)
Patient resting comfortably on nasal cannula.  Bipap not needed at this time.  Will continue to monitor.

## 2016-01-28 MED ORDER — THIAMINE HCL 100 MG PO TABS: 100.0000 mg | ORAL_TABLET | Freq: Every day | ORAL | Status: DC

## 2016-01-28 MED ORDER — FOLIC ACID 1 MG PO TABS: 1.0000 mg | ORAL_TABLET | Freq: Every day | ORAL | Status: DC

## 2016-01-28 MED ORDER — AMLODIPINE BESYLATE 10 MG PO TABS: 10.0000 mg | ORAL_TABLET | Freq: Every day | ORAL | Status: DC

## 2016-01-28 MED ORDER — AMLODIPINE BESYLATE 10 MG PO TABS
10.0000 mg | ORAL_TABLET | Freq: Every day | ORAL | Status: DC
  Administered 2016-01-28: 10 mg via ORAL
  Filled 2016-01-28: qty 1

## 2016-01-28 MED ORDER — LABETALOL HCL 200 MG PO TABS: 200.0000 mg | ORAL_TABLET | Freq: Two times a day (BID) | ORAL | Status: DC

## 2016-01-28 MED ORDER — HYDROCHLOROTHIAZIDE 25 MG PO TABS: 25.0000 mg | ORAL_TABLET | Freq: Every day | ORAL | Status: DC

## 2016-01-28 NOTE — Discharge Summary (Signed)
Physician Discharge Summary  Douglas Browning ZOX:[id number] DOB: [birth year] DOA: 01/23/2016  PCP: No PCP Per Patient  Admit date: 01/23/2016 Discharge date: 01/28/2016  Time spent: 25* minutes  Recommendations for Outpatient Follow-up:  1. Follow up PCP in 2 weeks   Discharge Diagnoses:  Active Problems:   Angioedema   Endotracheally intubated   Discharge Condition: Stable  Diet recommendation: Dysphagia 3 diet, mechanical soft diet with thin liquids  Filed Weights   01/25/16 0500 01/26/16 0600 01/27/16 0440  Weight: 99 kg (218 lb 4.1 oz) 97.9 kg (215 lb 13.3 oz) 91.5 kg (201 lb 11.5 oz)    History of present illness:  a 45 y.o. male with PMH of HTN and ETOH use. He presented to [facility] ED 04/17 with lip and tongue swelling along with difficult swallowing. Symptoms had started earlier that afternoon and progressed to the point where he felt that he had saliva pooling in the back of his throat due to difficulty swallowing. He came to ED where he was found to have angioedema, likely due to lisinopril. He has reportedly been on lisinopril for years without problem, never had similar episode before.  Per ED RN, EDP stated that intubation was straightforward and pt did not have difficult airway.  Hospital Course:  Angioedema- resolved, secondary to ACE inhibitor's. Patient required intubation and was extubated. Patient has been instructed not to use ace inhibitors in the future.  Hypertension- uncontrolled, started patient on labetalol 200 mg by mouth twice a day, amlodipine 10 mg by mouth daily, HCTZ 25 mg by mouth daily.   Alcohol abuse- no signs and symptoms of alcohol withdrawal at this time. Patient advised not to drink alcohol. Continue folate and thiamine.  Dysphagia- patient had dysphagia after extubation. Seen by speech therapy, at this time diet has been advanced to dysphagia 3 mechanical soft diet with thin liquids.  Procedures: SIGNIFICANT EVENTS: 04/18 > admitted with  angioedema presumably from linsinopril, required intubation in ED> 4/20 extubated  LINES/TUBES: ETT 04/18 >4/20  Consultations:  PCCM  Discharge Exam: Filed Vitals:   01/27/16 2114 01/28/16 0446  BP: 174/100 168/103  Pulse: 71 74  Temp: 98 F (36.7 C) 99.2 F (37.3 C)  Resp: 19 18    General: Appears in no acute distress Cardiovascular: S1-S2 regular Respiratory: Clear to auscultation bilaterally  Discharge Instructions   Discharge Instructions    Diet - low sodium heart healthy    Complete by:  As directed      Diet - low sodium heart healthy    Complete by:  As directed   Dysphagia 2 diet with thin liquids. Medications in puree. No straws.     Increase activity slowly    Complete by:  As directed      Increase activity slowly    Complete by:  As directed           Current Discharge Medication List    START taking these medications   Details  amLODipine (NORVASC) 10 MG tablet Take 1 tablet (10 mg total) by mouth daily. Qty: 30 tablet, Refills: 3    folic acid (FOLVITE) 1 MG tablet Take 1 tablet (1 mg total) by mouth daily. Qty: 30 tablet, Refills: 2    hydrochlorothiazide (HYDRODIURIL) 25 MG tablet Take 1 tablet (25 mg total) by mouth daily. Qty: 30 tablet, Refills: 2    labetalol (NORMODYNE) 200 MG tablet Take 1 tablet (200 mg total) by mouth 2 (two) times daily. Qty: 60 tablet, Refills: 2  thiamine 100 MG tablet Take 1 tablet (100 mg total) by mouth daily. Qty: 30 tablet, Refills: 2      CONTINUE these medications which have NOT CHANGED   Details  aspirin EC 81 MG tablet Take 81 mg by mouth daily.    diphenhydrAMINE (BENADRYL) 25 MG tablet Take 25 mg by mouth every 6 (six) hours as needed for allergies.       Allergies  Allergen Reactions  . Lisinopril Swelling    angioedema   Follow-up Information    Schedule an appointment as soon as possible for a visit with [facility].   Contact information:   [address] [place] [address] [phone]       The results of significant diagnostics from this hospitalization (including imaging, microbiology, ancillary and laboratory) are listed below for reference.    Significant Diagnostic Studies: Dg Chest Port 1 View  01/25/2016  CLINICAL DATA:  Endotracheal tube assessment EXAM: PORTABLE CHEST 1 VIEW COMPARISON:  01/25/2016 FINDINGS: Endotracheal tube positioned 4 cm from carina. NG tube extends to stomach. Normal cardiac silhouette. No effusion, infiltrate, or pneumothorax. IMPRESSION: Endotracheal tube in good position. Electronically Signed   By: Genevive Bi M.D.   On: 01/25/2016 10:26   Dg Chest Port 1 View  01/25/2016  CLINICAL DATA:  Respiratory failure. EXAM: PORTABLE CHEST 1 VIEW COMPARISON:  January 24, 2016. FINDINGS: Stable cardiomediastinal silhouette. Endotracheal tube is in grossly good position. Interval placement of nasogastric tube which enters stomach. No pneumothorax or pleural effusion is noted. Mild bibasilar subsegmental atelectasis is noted. Bony thorax is unremarkable. IMPRESSION: Interval placement of nasogastric tube. Mildly increased bibasilar subsegmental atelectasis. Electronically Signed   By: Lupita Raider, M.D.   On: 01/25/2016 07:14   Dg Chest Port 1 View  01/24/2016  CLINICAL DATA:  Post intubation. EXAM: PORTABLE CHEST 1 VIEW COMPARISON:  04/29/2006 FINDINGS: Endotracheal tube 5.5 cm from the carina. Lung volumes are low. Heart size is normal for technique. Crowding of bronchovascular structures likely secondary to low lung volumes. No confluent airspace disease, pleural effusion or pneumothorax. IMPRESSION: 1. Endotracheal tube 5.5 cm from the carina. 2. Low lung volumes with bronchovascular crowding. Electronically Signed   By: Rubye Oaks M.D.   On: 01/24/2016 00:50    Microbiology: Recent Results (from the past 240 hour(s))  MRSA PCR Screening     Status: None   Collection Time:  01/24/16  2:53 AM  Result Value Ref Range Status   MRSA by PCR NEGATIVE NEGATIVE Final    Comment:        The GeneXpert MRSA Assay (FDA approved for NASAL specimens only), is one component of a comprehensive MRSA colonization surveillance program. It is not intended to diagnose MRSA infection nor to guide or monitor treatment for MRSA infections.      Labs: Basic Metabolic Panel:  Recent Labs Lab 01/23/16 2255 01/24/16 0330 01/25/16 0209 01/26/16 0249  NA 143 140 141 144  K 3.2* 4.0 4.2 4.0  CL 109 106 108 110  CO2 GLUCOSE 92 160* 123* 135*  BUN CREATININE 1.03 0.87 0.84 0.89  CALCIUM 8.9 8.8* 9.0 8.8*  MG  --  1.8  --   --   PHOS  --  4.0  --   --    CBC:  Recent Labs Lab 01/23/16 2255 01/24/16 0330  WBC 8.5 6.6  HGB 14.6 14.0  HCT  44.1 43.8  MCV 94.2 94.4  PLT 173 165    CBG:  Recent Labs Lab 01/26/16 1938 01/26/16 2337 01/27/16 0341 01/27/16 0735 01/27/16 1123  GLUCAP 89 125* 119* 89 91       Signed:  Alura Olveda S MD.  [facility] 01/28/2016, 11:39 AM

## 2016-01-28 NOTE — Progress Notes (Signed)
Speech Language Pathology Treatment: Dysphagia  Patient Details Name: Douglas Browning MRN: [id number] DOB: Dec 28, 1970 Today's Date: 01/28/2016 Time: 1340-1400 SLP Time Calculation (min) (ACUTE ONLY): 20 min  Assessment / Plan / Recommendation Clinical Impression  Pt c/o pain with swallowing solids at this time, but he is able to clear material from pharynx with repetitive swallows and liquid wash; currently on Dysphagia 2 (chopped) diet with thin liquids and no straws and small bites/sips as swallowing precaution; pt modified independent with following precautions; delayed cough noted with solids (cracker) during intake, but pt stated "it hurt when swallowing hard foods."  Recommend upgrade to Dysphagia 3/thin as tolerated by pt utilizing small sips/bites; pt stated he does not use straws, but only takes small sips when drinking liquids; no s/s of aspiration noted during po intake, but pt is c/o pain with swallowing certain consistencies.     HPI HPI: 45 y.o. M who takes lisinopril as outpatient for HTN. He presented 04/18 with lip and tongue swelling and difficulty swallowing due to angioedema. He required intubation 4/18-4/20.      SLP Plan  Continue with current plan of care     Recommendations  Diet recommendations: Dysphagia 3 (mechanical soft);Thin liquid Liquids provided via: Cup Medication Administration: Whole meds with puree Supervision: Patient able to self feed Compensations: Slow rate;Small sips/bites Postural Changes and/or Swallow Maneuvers: Seated upright 90 degrees             Oral Care Recommendations: Oral care BID Follow up Recommendations: None Plan: Continue with current plan of care                     Savas Elvin,PAT, M.S., CCC-SLP 01/28/2016, 2:03 PM

## 2016-02-14 ENCOUNTER — Ambulatory Visit: Payer: Self-pay | Attending: Internal Medicine | Admitting: Internal Medicine

## 2016-02-14 ENCOUNTER — Encounter: Payer: Self-pay | Admitting: Internal Medicine

## 2016-02-14 VITALS — BP 160/99 | HR 82 | Temp 98.4°F | Wt 207.8 lb

## 2016-02-14 DIAGNOSIS — R739 Hyperglycemia, unspecified: Secondary | ICD-10-CM

## 2016-02-14 DIAGNOSIS — T783XXS Angioneurotic edema, sequela: Secondary | ICD-10-CM

## 2016-02-14 DIAGNOSIS — I10 Essential (primary) hypertension: Secondary | ICD-10-CM

## 2016-02-14 DIAGNOSIS — F17200 Nicotine dependence, unspecified, uncomplicated: Secondary | ICD-10-CM

## 2016-02-14 MED ORDER — HYDROCHLOROTHIAZIDE 25 MG PO TABS: 25.0000 mg | ORAL_TABLET | Freq: Every day | ORAL | Status: DC

## 2016-02-14 MED ORDER — AMLODIPINE BESYLATE 10 MG PO TABS: 10.0000 mg | ORAL_TABLET | Freq: Every day | ORAL | Status: DC

## 2016-02-14 MED ORDER — ASPIRIN EC 81 MG PO TBEC: 81.0000 mg | DELAYED_RELEASE_TABLET | Freq: Every day | ORAL | Status: AC

## 2016-02-14 MED ORDER — LABETALOL HCL 300 MG PO TABS: 300.0000 mg | ORAL_TABLET | Freq: Two times a day (BID) | ORAL | Status: DC

## 2016-02-14 MED FILL — ?HYDROCHLOROTHIAZIDE 25 MG: 25 MG | 30 days supply | Qty: 30 | Fill #0

## 2016-02-14 MED FILL — LABETALOL HCL 300 MG TABLET: 300 | 30 days supply | Qty: 60 | Fill #0

## 2016-02-14 MED FILL — AMLODIPINE BESYLATE 10 MG T: 10 | 90 days supply | Qty: 90 | Fill #0

## 2016-02-14 NOTE — Progress Notes (Signed)
Douglas Browning, is a 45 y.o. male  XBJ:[id number]:[id number]  HYQ:[id number]  DOB - 1971-07-11  CC:  Chief Complaint  Patient presents with  . Hospitalization Follow-up    Angioedema       HPI: Douglas Browning is a 45 y.o. male w/ PMHx w. etoh use, htn, tob use, sp recently hospitalization 4/17-4/22 for angioedema, felt 2nd to lisinopril which he was on for about 2 years without problems, requiring emergent  Intubation.  Subsequently extubated 01/29/16 and discharged, here for f/u.  Per pt, he is not Vaping rather than smoking and [facility].  He is still wearing his nicoderm patch as well.  He still c/o of mild sore throat, but no other problems.  Here today to establish medical care.  Per pt, eats a low salt diet and is drinking less alcohol as prior as well.  Patient has No headache, No chest pain, No abdominal pain - No Nausea, No new weakness tingling or numbness, No Cough - SOB.  Allergies  Allergen Reactions  . Lisinopril Swelling    angioedema   Past Medical History  Diagnosis Date  . Hypertension    Current Outpatient Prescriptions on File Prior to Visit  Medication Sig Dispense Refill  . diphenhydrAMINE (BENADRYL) 25 MG tablet Take 25 mg by mouth every 6 (six) hours as needed for allergies.    . folic acid (FOLVITE) 1 MG tablet Take 1 tablet (1 mg total) by mouth daily. 30 tablet 2  . thiamine 100 MG tablet Take 1 tablet (100 mg total) by mouth daily. 30 tablet 2   No current facility-administered medications on file prior to visit.   No family history on file. Social History   Social History  . Marital Status: Single    Spouse Name: N/A  . Number of Children: N/A  . Years of Education: N/A   Occupational History  . Not on file.   Social History Main Topics  . Smoking status: Current Every Day Smoker  . Smokeless tobacco: Not on file  . Alcohol Use: Yes  . Drug Use: No  . Sexual Activity: Not on file   Other Topics Concern  .  Not on file   Social History Narrative    Review of Systems: Constitutional: Negative for fever, chills, diaphoresis, activity change, appetite change and fatigue. HENT: Negative for ear pain, nosebleeds, congestion, facial swelling, rhinorrhea, neck pain, neck stiffness and ear discharge.   +mild sore throat (sp extubation few weeks ago) but no difficulty swallowing.   Eyes: Negative for pain, discharge, redness, itching and visual disturbance. Respiratory: Negative for cough, choking, chest tightness, shortness of breath, wheezing and stridor.  Cardiovascular: Negative for chest pain, palpitations and leg swelling. Gastrointestinal: Negative for abdominal distention. Genitourinary: Negative for dysuria, urgency, frequency, hematuria, flank pain, decreased urine volume, difficulty urinating and dyspareunia.  Musculoskeletal: Negative for back pain, joint swelling, arthralgia and gait problem. Neurological: Negative for dizziness, tremors, seizures, syncope, facial asymmetry, speech difficulty, weakness, light-headedness, numbness and headaches.  Hematological: Negative for adenopathy. Does not bruise/bleed easily. Psychiatric/Behavioral: Negative for hallucinations, behavioral problems, confusion, dysphoric mood, decreased concentration and agitation.    Objective:   Filed Vitals:   02/14/16 1444  BP: 160/99  Pulse: 82  Temp: 98.4 F (36.9 C)    Filed Weights   02/14/16 1444  Weight: 207 lb 12.8 oz (94.257 kg)    BP Readings from Last 3 Encounters:  02/14/16 160/99  01/28/16 145/100  Physical Exam: Constitutional: Patient appears well-developed and well-nourished. No distress. AAOx3, pleasant. HENT: Normocephalic, atraumatic, External right and left ear normal. Oropharynx is clear and moist.  Eyes: Conjunctivae and EOM are normal. PERRL, no scleral icterus. Neck: Normal ROM. Neck supple. No JVD. No tracheal deviation.  No carotid bruits. CVS: RRR, S1/S2 +, no murmurs,  no gallops, no carotid bruit.  Pulmonary: Effort and breath sounds normal, no stridor, rhonchi, wheezes, rales.  Abdominal: Soft. BS +, no distension, tenderness, rebound or guarding.  Musculoskeletal: Normal range of motion. No edema and no tenderness.  LE: bilat/ no c/c/e, pulses 2+ bilateral. Lymphadenopathy: No lymphadenopathy noted, cervical Neuro: Alert.  muscle tone coordination. No cranial nerve deficit grossly. Skin: Skin is warm and dry. No rash noted. Not diaphoretic. No erythema. No pallor. Psychiatric: Normal mood and affect. Behavior, judgment, thought content normal.  Lab Results  Component Value Date   WBC 6.6 01/24/2016   HGB 14.0 01/24/2016   HCT 43.8 01/24/2016   MCV 94.4 01/24/2016   PLT 165 01/24/2016   Lab Results  Component Value Date   CREATININE 0.89 01/26/2016   BUN 18 01/26/2016   NA 144 01/26/2016   K 4.0 01/26/2016   CL 110 01/26/2016   CO2 23 01/26/2016    No results found for: HGBA1C Lipid Panel     Component Value Date/Time   TRIG 125 01/26/2016 0945       Depression screen PHQ 2/9 02/14/2016  Decreased Interest 0  Down, Depressed, Hopeless 0  PHQ - 2 Score 0  Altered sleeping 0  Tired, decreased energy 0  Change in appetite 0  Feeling bad or failure about yourself  0  Trouble concentrating 0  Moving slowly or fidgety/restless 0  Suicidal thoughts 0  PHQ-9 Score 0  Difficult doing work/chores Not difficult at all    Assessment and plan:   1. Essential hypertension - remains uncontrolled despite several medications, labetolol 200bid, norvasc 10, and hctz 25 - ACEI allergy - will increase labetolol to  po bid for now. - BASIC METABOLIC PANEL WITH GFR - Lipid Panel; Future tomorw  2. Hyperglycemia Suspect due to decadron he was on, will check screening Hemoglobin A1c  3. Angioedema, sequela resolved  4. tob abuse - now down to 10cig/evape daily, congratulated on decrease smoking, encouraged complete cessation - pt  planning to go to [facility] cessation program, still using nicoderm patch  5. Sore throat, very mild - trial lozanges and gargle w/ salt, suspect due to recent intubation, will follow.  Return in about 2 months (around 04/15/2016) for htn; 2-3 months.  The patient was given clear instructions to go to ER or return to medical center if symptoms don't improve, worsen or new problems develop. The patient verbalized understanding. The patient was told to call to get lab results if they haven't heard anything in the next week.      Pete Glatter, MD, MBA/MHA [facility] [place], [place] [phone]   02/14/2016, 3:32 PM

## 2016-02-14 NOTE — Patient Instructions (Signed)
You Can Quit Smoking If you are ready to quit smoking or are thinking about it, congratulations! You have chosen to help yourself be healthier and live longer! There are lots of different ways to quit smoking. Nicotine gum, nicotine patches, a nicotine inhaler, or nicotine nasal spray can help with physical craving. Hypnosis, support groups, and medicines help break the habit of smoking. TIPS TO GET OFF AND STAY OFF CIGARETTES  Learn to predict your moods. Do not let a bad situation be your excuse to have a cigarette. Some situations in your life might tempt you to have a cigarette.  Ask friends and co-workers not to smoke around you.  Make your home smoke-free.  Never have "just one" cigarette. It leads to wanting another and another. Remind yourself of your decision to quit.  On a card, make a list of your reasons for not smoking. Read it at least the same number of times a day as you have a cigarette. Tell yourself everyday, "I do not want to smoke. I choose not to smoke."  Ask someone at home or work to help you with your plan to quit smoking.  Have something planned after you eat or have a cup of coffee. Take a walk or get other exercise to perk you up. This will help to keep you from overeating.  Try a relaxation exercise to calm you down and decrease your stress. Remember, you may be tense and nervous the first two weeks after you quit. This will pass.  Find new activities to keep your hands busy. Play with a pen, coin, or rubber band. Doodle or draw things on paper.  Brush your teeth right after eating. This will help cut down the craving for the taste of tobacco after meals. You can try mouthwash too.  Try gum, breath mints, or diet candy to keep something in your mouth. IF YOU SMOKE AND WANT TO QUIT:  Do not stock up on cigarettes. Never buy a carton. Wait until one pack is finished before you buy another.  Never carry cigarettes with you at work or at home.  Keep cigarettes  as far away from you as possible. Leave them with someone else.  Never carry matches or a lighter with you.  Ask yourself, "Do I need this cigarette or is this just a reflex?"  Bet with someone that you can quit. Put cigarette money in a piggy bank every morning. If you smoke, you give up the money. If you do not smoke, by the end of the week, you keep the money.  Keep trying. It takes 21 days to change a habit!  Talk to your doctor about using medicines to help you quit. These include nicotine replacement gum, lozenges, or skin patches.   This information is not intended to replace advice given to you by your health care provider. Make sure you discuss any questions you have with your health care provider.   Document Released: 07/21/2009 Document Revised: 12/17/2011 Document Reviewed: 07/21/2009 Elsevier Interactive Patient Education 2016 Elsevier Inc.  - Low-Sodium Eating Plan Sodium raises blood pressure and causes water to be held in the body. Getting less sodium from food will help lower your blood pressure, reduce any swelling, and protect your heart, liver, and kidneys. We get sodium by adding salt (sodium chloride) to food. Most of our sodium comes from canned, boxed, and frozen foods. Restaurant foods, fast foods, and pizza are also very high in sodium. Even if you take medicine to lower  your blood pressure or to reduce fluid in your body, getting less sodium from your food is important. WHAT IS MY PLAN? Most people should limit their sodium intake to 2,300 mg a day. Your health care provider recommends that you limit your sodium intake to __________ a day.  WHAT DO I NEED TO KNOW ABOUT THIS EATING PLAN? For the low-sodium eating plan, you will follow these general guidelines:  Choose foods with a % Daily Value for sodium of less than 5% (as listed on the food label).   Use salt-free seasonings or herbs instead of table salt or sea salt.   Check with your health care provider  or pharmacist before using salt substitutes.   Eat fresh foods.  Eat more vegetables and fruits.  Limit canned vegetables. If you do use them, rinse them well to decrease the sodium.   Limit cheese to 1 oz (28 g) per day.   Eat lower-sodium products, often labeled as "lower sodium" or "no salt added."  Avoid foods that contain monosodium glutamate (MSG). MSG is sometimes added to [place] food and some canned foods.  Check food labels (Nutrition Facts labels) on foods to learn how much sodium is in one serving.  Eat more home-cooked food and less restaurant, buffet, and fast food.  When eating at a restaurant, ask that your food be prepared with less salt, or no salt if possible.  HOW DO I READ FOOD LABELS FOR SODIUM INFORMATION? The Nutrition Facts label lists the amount of sodium in one serving of the food. If you eat more than one serving, you must multiply the listed amount of sodium by the number of servings. Food labels may also identify foods as:  Sodium free--Less than 5 mg in a serving.  Very low sodium--35 mg or less in a serving.  Low sodium--140 mg or less in a serving.  Light in sodium--50% less sodium in a serving. For example, if a food that usually has 300 mg of sodium is changed to become light in sodium, it will have 150 mg of sodium.  Reduced sodium--25% less sodium in a serving. For example, if a food that usually has 400 mg of sodium is changed to reduced sodium, it will have 300 mg of sodium. WHAT FOODS CAN I EAT? Grains Low-sodium cereals, including oats, puffed wheat and rice, and shredded wheat cereals. Low-sodium crackers. Unsalted rice and pasta. Lower-sodium bread.  Vegetables Frozen or fresh vegetables. Low-sodium or reduced-sodium canned vegetables. Low-sodium or reduced-sodium tomato sauce and paste. Low-sodium or reduced-sodium tomato and vegetable juices.  Fruits Fresh, frozen, and canned fruit. Fruit juice.  Meat and Other Protein  Products Low-sodium canned tuna and salmon. Fresh or frozen meat, poultry, seafood, and fish. Lamb. Unsalted nuts. Dried beans, peas, and lentils without added salt. Unsalted canned beans. Homemade soups without salt. Eggs.  Dairy Milk. Soy milk. Ricotta cheese. Low-sodium or reduced-sodium cheeses. Yogurt.  Condiments Fresh and dried herbs and spices. Salt-free seasonings. Onion and garlic powders. Low-sodium varieties of mustard and ketchup. Fresh or refrigerated horseradish. Lemon juice.  Fats and Oils Reduced-sodium salad dressings. Unsalted butter.  Other Unsalted popcorn and pretzels.  The items listed above may not be a complete list of recommended foods or beverages. Contact your dietitian for more options. WHAT FOODS ARE NOT RECOMMENDED? Grains Instant hot cereals. Bread stuffing, pancake, and biscuit mixes. Croutons. Seasoned rice or pasta mixes. Noodle soup cups. Boxed or frozen macaroni and cheese. Self-rising flour. Regular salted crackers. Vegetables Regular canned  vegetables. Regular canned tomato sauce and paste. Regular tomato and vegetable juices. Frozen vegetables in sauces. Salted [place] fries. Olives. Rosita FirePickles. Relishes. Sauerkraut. Salsa. Meat and Other Protein Products Salted, canned, smoked, spiced, or pickled meats, seafood, or fish. Bacon, ham, sausage, hot dogs, corned beef, chipped beef, and packaged luncheon meats. Salt pork. Jerky. Pickled herring. Anchovies, regular canned tuna, and sardines. Salted nuts. Dairy Processed cheese and cheese spreads. Cheese curds. Blue cheese and cottage cheese. Buttermilk.  Condiments Onion and garlic salt, seasoned salt, table salt, and sea salt. Canned and packaged gravies. Worcestershire sauce. Tartar sauce. Barbecue sauce. Teriyaki sauce. Soy sauce, including reduced sodium. Steak sauce. Fish sauce. Oyster sauce. Cocktail sauce. Horseradish that you find on the shelf. Regular ketchup and mustard. Meat flavorings and  tenderizers. Bouillon cubes. Hot sauce. Tabasco sauce. Marinades. Taco seasonings. Relishes. Fats and Oils Regular salad dressings. Salted butter. Margarine. Ghee. Bacon fat.  Other Potato and tortilla chips. Corn chips and puffs. Salted popcorn and pretzels. Canned or dried soups. Pizza. Frozen entrees and pot pies.  The items listed above may not be a complete list of foods and beverages to avoid. Contact your dietitian for more information.   This information is not intended to replace advice given to you by your health care provider. Make sure you discuss any questions you have with your health care provider.   Document Released: 03/16/2002 Document Revised: 10/15/2014 Document Reviewed: 07/29/2013 Elsevier Interactive Patient Education [employer]2016 Elsevier Inc.

## 2016-02-15 ENCOUNTER — Ambulatory Visit: Payer: Self-pay | Attending: Internal Medicine

## 2016-02-15 DIAGNOSIS — I10 Essential (primary) hypertension: Secondary | ICD-10-CM | POA: Insufficient documentation

## 2016-02-15 NOTE — Progress Notes (Signed)
Pt is here for lab work only. 

## 2016-02-15 NOTE — Addendum Note (Signed)
Addended by: Dierdre SearlesLANGELAND, Kamani Lewter T on: 02/15/2016 09:46 AM   Modules accepted: Orders

## 2016-02-16 LAB — LIPID PANEL
CHOL/HDL RATIO: 3.9 ratio (ref <=5.0)
CHOLESTEROL: 215 mg/dL — AB (ref 125–200)
HDL: 55 mg/dL (ref >=40)
LDL Cholesterol: 128 mg/dL (ref <130)
Triglycerides: 158 mg/dL — ABNORMAL HIGH (ref <150)
VLDL: 32 mg/dL — ABNORMAL HIGH (ref <30)

## 2016-03-02 MED FILL — FOLIC ACID 1 MG TABLET: 1 | 30 days supply | Qty: 30 | Fill #0

## 2016-04-06 MED FILL — FOLIC ACID 1 MG TABLET: 1 | 30 days supply | Qty: 30 | Fill #1

## 2016-04-06 MED FILL — HYDROCHLOROTHIAZIDE 25 MG T: 25 | 30 days supply | Qty: 30 | Fill #1

## 2016-04-06 MED FILL — LABETALOL HCL 300 MG TABLET: 300 | 30 days supply | Qty: 60 | Fill #1

## 2016-08-04 ENCOUNTER — Emergency Department (HOSPITAL_COMMUNITY)
Admission: EM | Admit: 2016-08-04 | Discharge: 2016-08-05 | Disposition: A | Discharge status: Home / Self Care | Payer: Self-pay | Attending: Emergency Medicine | Admitting: Emergency Medicine

## 2016-08-04 ENCOUNTER — Encounter (HOSPITAL_COMMUNITY): Payer: Self-pay | Admitting: *Deleted

## 2016-08-04 DIAGNOSIS — F1010 Alcohol abuse, uncomplicated: Secondary | ICD-10-CM

## 2016-08-04 DIAGNOSIS — Z7982 Long term (current) use of aspirin: Secondary | ICD-10-CM | POA: Insufficient documentation

## 2016-08-04 DIAGNOSIS — F17200 Nicotine dependence, unspecified, uncomplicated: Secondary | ICD-10-CM | POA: Insufficient documentation

## 2016-08-04 DIAGNOSIS — I10 Essential (primary) hypertension: Secondary | ICD-10-CM | POA: Insufficient documentation

## 2016-08-04 DIAGNOSIS — Z79899 Other long term (current) drug therapy: Secondary | ICD-10-CM | POA: Insufficient documentation

## 2016-08-04 DIAGNOSIS — F10229 Alcohol dependence with intoxication, unspecified: Secondary | ICD-10-CM | POA: Insufficient documentation

## 2016-08-04 LAB — COMPREHENSIVE METABOLIC PANEL
ALBUMIN: 4.7 g/dL (ref 3.5–5.0)
ALT: 49 U/L (ref 17–63)
ANION GAP: 17 — AB (ref 5–15)
AST: 69 U/L — ABNORMAL HIGH (ref 15–41)
Alkaline Phosphatase: 54 U/L (ref 38–126)
BILIRUBIN TOTAL: 0.8 mg/dL (ref 0.3–1.2)
BUN: 7 mg/dL (ref 6–20)
CALCIUM: 8.5 mg/dL — AB (ref 8.9–10.3)
CO2: 21 mmol/L — AB (ref 22–32)
CREATININE: 0.82 mg/dL (ref 0.61–1.24)
Chloride: 109 mmol/L (ref 101–111)
GFR calc Af Amer: >60 mL/min (ref >60)
GFR calc non Af Amer: >60 mL/min (ref >60)
GLUCOSE: 71 mg/dL (ref 65–99)
Potassium: 4.1 mmol/L (ref 3.5–5.1)
SODIUM: 147 mmol/L — AB (ref 135–145)
TOTAL PROTEIN: 7.8 g/dL (ref 6.5–8.1)

## 2016-08-04 LAB — CBC
HCT: 47.3 % (ref 39.0–52.0)
Hemoglobin: 16.2 g/dL (ref 13.0–17.0)
MCH: 32.1 pg (ref 26.0–34.0)
MCHC: 34.2 g/dL (ref 30.0–36.0)
MCV: 93.7 fL (ref 78.0–100.0)
PLATELETS: 248 10*3/uL (ref 150–400)
RBC: 5.05 MIL/uL (ref 4.22–5.81)
RDW: 13 % (ref 11.5–15.5)
WBC: 8.9 10*3/uL (ref 4.0–10.5)

## 2016-08-04 LAB — CBG MONITORING, ED: Glucose-Capillary: 77 mg/dL (ref 65–99)

## 2016-08-04 LAB — SALICYLATE LEVEL

## 2016-08-04 LAB — ETHANOL: Alcohol, Ethyl (B): 364 mg/dL (ref <5)

## 2016-08-04 LAB — ACETAMINOPHEN LEVEL: Acetaminophen (Tylenol), Serum: <10 ug/mL — ABNORMAL LOW (ref 10–30)

## 2016-08-04 NOTE — ED Triage Notes (Signed)
Pt girlfriend states the pt has been drinking alcohol and shaking uncontrollably for the past 3 days. Pt states he had a pint of liquor today. Pt states he would like to stop drinking. Pt difficult to stay on topic in triage, repeats "in the name of Jesus" and "don't let me die".

## 2016-08-04 NOTE — ED Provider Notes (Signed)
[facility]-EMERGENCY DEPT Provider Note   CSN: [id number] Arrival date & time: 08/04/16  2128  By signing my name below, I, Sonum Patel, attest that this documentation has been prepared under the direction and in the presence of Nikola Blackston A Krislynn Gronau PA-C,. Electronically Signed: Sonum Patel, Neurosurgeoncribe. 08/04/16. 11:40 PM.  History   Chief Complaint Chief Complaint  Patient presents with  . Alcohol Problem    The history is provided by the patient and a friend. No language interpreter was used.     HPI Comments: Douglas Browning is a 45 y.o. male who presents to the Emergency Department complaining of a few days of intermittent entire body jerking. Significant other states patient has had similar episodes like this that last for ~ 3 days but usually resolve without intervention. States he drinks an entire pint of liquor daily, and his last drink was PTA. He recently began drinking this much and states it is because of general family/work stressors in his life. He denies auditory or visual hallucination, SI, HI.    Past Medical History:  Diagnosis Date  . Hypertension     Patient Active Problem List   Diagnosis Date Noted  . Endotracheally intubated   . Angioedema 01/24/2016  . Acute respiratory failure with hypoxia (HCC)   . Essential hypertension   . Hypokalemia   . Alcohol abuse     History reviewed. No pertinent surgical history.     Home Medications    Prior to Admission medications   Medication Sig Start Date End Date Taking? Authorizing Provider  amLODipine (NORVASC) 10 MG tablet Take 1 tablet (10 mg total) by mouth daily. 02/14/16   Pete Glatterawn T Langeland, MD  aspirin EC 81 MG tablet Take 1 tablet (81 mg total) by mouth daily. 02/14/16   Pete Glatterawn T Langeland, MD  diphenhydrAMINE (BENADRYL) 25 MG tablet Take 25 mg by mouth every 6 (six) hours as needed for allergies.    Historical Provider, MD  folic acid (FOLVITE) 1 MG tablet Take 1 tablet (1 mg total) by mouth daily. 01/28/16   Meredeth IdeGagan S Lama,  MD  hydrochlorothiazide (HYDRODIURIL) 25 MG tablet Take 1 tablet (25 mg total) by mouth daily. 02/14/16   Pete Glatterawn T Langeland, MD  labetalol (NORMODYNE) 300 MG tablet Take 1 tablet (300 mg total) by mouth 2 (two) times daily. 02/14/16   Pete Glatterawn T Langeland, MD  thiamine 100 MG tablet Take 1 tablet (100 mg total) by mouth daily. 01/28/16   Meredeth IdeGagan S Lama, MD    Family History No family history on file.  Social History Social History  Substance Use Topics  . Smoking status: Current Every Day Smoker  . Smokeless tobacco: Never Used  . Alcohol use Yes     Allergies   Lisinopril   Review of Systems Review of Systems  Constitutional: Negative for fever.  HENT: Negative.   Respiratory: Negative.   Cardiovascular: Negative.   Gastrointestinal: Negative.   Musculoskeletal: Negative.   Neurological: Positive for tremors. Negative for seizures and headaches.  Psychiatric/Behavioral: Negative for hallucinations and suicidal ideas.  All other systems reviewed and are negative.    Physical Exam Updated Vital Signs BP 125/80   Pulse 99   Temp 98.1 F (36.7 C) (Oral)   Resp 18   SpO2 96%   Physical Exam  Constitutional: He is oriented to person, place, and time. He appears well-developed and well-nourished.  Acutely intoxicated.   HENT:  Head: Normocephalic and atraumatic.  Neck: Normal range of motion. Neck  supple.  Cardiovascular: Normal rate and regular rhythm.   Pulmonary/Chest: Effort normal and breath sounds normal. He has no wheezes. He has no rales.  Abdominal: Soft. Bowel sounds are normal. There is no tenderness. There is no rebound and no guarding.  Musculoskeletal: Normal range of motion.  Neurological: He is alert and oriented to person, place, and time.  Patient awake, lying on stretcher, responsive to questions. Has regular periodic spastic movement of torso and arms. CN's 3-12 intact. Speech slurred but coherent, thought secondary to alcohol. No lateralizing weakness.     Skin: Skin is warm and dry. No rash noted.  Psychiatric: He has a normal mood and affect.  Nursing note and vitals reviewed.    ED Treatments / Results  DIAGNOSTIC STUDIES: Oxygen Saturation is 96% on RA, adequate by my interpretation.    COORDINATION OF CARE: 11:40 PM Discussed treatment plan with pt at bedside and pt agreed to plan.    Labs (all labs ordered are listed, but only abnormal results are displayed) Labs Reviewed  COMPREHENSIVE METABOLIC PANEL - Abnormal; Notable for the following:       Result Value   Sodium 147 (*)    CO2 21 (*)    Calcium 8.5 (*)    AST 69 (*)    Anion gap 17 (*)    All other components within normal limits  ETHANOL - Abnormal; Notable for the following:    Alcohol, Ethyl (B) 364 (*)    All other components within normal limits  ACETAMINOPHEN LEVEL - Abnormal; Notable for the following:    Acetaminophen (Tylenol), Serum <10 (*)    All other components within normal limits  CBC  SALICYLATE LEVEL  RAPID URINE DRUG SCREEN, HOSP PERFORMED  CBG MONITORING, ED    EKG  EKG Interpretation None       Radiology No results found.  Procedures Procedures (including critical care time)  Medications Ordered in ED Medications - No data to display   Initial Impression / Assessment and Plan / ED Course  I have reviewed the triage vital signs and the nursing notes.  Pertinent labs & imaging results that were available during my care of the patient were reviewed by me and considered in my medical decision making (see chart for details).  Clinical Course   Patient here with wife/significant other who is concerned with increased alcohol use and current intoxication. Spastic movement are not new, but are worse when he drinks. The patient states he wants here with quitting drinking. He continues to deny SI/HI, hallucinations and wife/SO corroborates.   He can be discharged home with resources. Encouraged to follow up with PCP for evaluation of  spasms.   Final Clinical Impressions(s) / ED Diagnoses   Final diagnoses:  None   1. Alcohol intoxication 2. Alcohol dependence  New Prescriptions New Prescriptions   No medications on file   I personally performed the services described in this documentation, which was scribed in my presence. The recorded information has been reviewed and is accurate.      Elpidio AnisShari Karmyn Lowman, PA-C 08/12/16 0037    Rolland PorterMark James, MD 09/05/16 1045

## 2016-08-05 MED ORDER — CHLORDIAZEPOXIDE HCL 25 MG PO CAPS: ORAL_CAPSULE | ORAL | 0 refills | Status: DC

## 2016-08-05 MED ORDER — LORAZEPAM 2 MG/ML IJ SOLN
0.5000 mg | Freq: Once | INTRAMUSCULAR | Status: AC
  Administered 2016-08-05: 0.5 mg via INTRAVENOUS
  Filled 2016-08-05: qty 1

## 2016-09-04 MED FILL — AMLODIPINE BESYLATE 10 MG T: 10 | 90 days supply | Qty: 90 | Fill #1

## 2016-09-04 MED FILL — LABETALOL HCL 300 MG TABLET: 300 | 30 days supply | Qty: 60 | Fill #2

## 2016-11-16 ENCOUNTER — Encounter (HOSPITAL_COMMUNITY): Payer: Self-pay | Admitting: *Deleted

## 2016-11-16 ENCOUNTER — Emergency Department (HOSPITAL_COMMUNITY)
Admission: EM | Admit: 2016-11-16 | Discharge: 2016-11-16 | Disposition: A | Discharge status: Home / Self Care | Payer: Self-pay | Attending: Dermatology | Admitting: Dermatology

## 2016-11-16 DIAGNOSIS — F10239 Alcohol dependence with withdrawal, unspecified: Secondary | ICD-10-CM | POA: Insufficient documentation

## 2016-11-16 DIAGNOSIS — Z5321 Procedure and treatment not carried out due to patient leaving prior to being seen by health care provider: Secondary | ICD-10-CM | POA: Insufficient documentation

## 2016-11-16 NOTE — ED Triage Notes (Signed)
Per EMS, pt w/ hx of alcoholism here for detox. Pt denies n/v/abd pain. Pt does not have hx of alcohol withdrawal seizures or DTs.

## 2016-12-05 MED FILL — LABETALOL HCL 300 MG TABLET: 300 | 30 days supply | Qty: 60 | Fill #3

## 2016-12-05 MED FILL — HYDROCHLOROTHIAZIDE 25 MG T: 25 | 30 days supply | Qty: 30 | Fill #2

## 2017-05-22 ENCOUNTER — Ambulatory Visit: Payer: Self-pay | Admitting: Family Medicine

## 2017-06-05 ENCOUNTER — Encounter: Payer: Self-pay | Admitting: Family Medicine

## 2017-06-05 ENCOUNTER — Ambulatory Visit: Payer: Self-pay | Attending: Family Medicine | Admitting: Family Medicine

## 2017-06-05 VITALS — BP 153/88 | HR 101 | Temp 98.8°F | Resp 18 | Ht 73.0 in | Wt 192.2 lb

## 2017-06-05 DIAGNOSIS — H539 Unspecified visual disturbance: Secondary | ICD-10-CM | POA: Insufficient documentation

## 2017-06-05 DIAGNOSIS — F1020 Alcohol dependence, uncomplicated: Secondary | ICD-10-CM | POA: Insufficient documentation

## 2017-06-05 DIAGNOSIS — H1033 Unspecified acute conjunctivitis, bilateral: Secondary | ICD-10-CM | POA: Insufficient documentation

## 2017-06-05 DIAGNOSIS — I10 Essential (primary) hypertension: Secondary | ICD-10-CM | POA: Insufficient documentation

## 2017-06-05 DIAGNOSIS — Z7982 Long term (current) use of aspirin: Secondary | ICD-10-CM | POA: Insufficient documentation

## 2017-06-05 DIAGNOSIS — F1029 Alcohol dependence with unspecified alcohol-induced disorder: Secondary | ICD-10-CM

## 2017-06-05 DIAGNOSIS — Z79899 Other long term (current) drug therapy: Secondary | ICD-10-CM | POA: Insufficient documentation

## 2017-06-05 DIAGNOSIS — F32A Depression, unspecified: Secondary | ICD-10-CM

## 2017-06-05 DIAGNOSIS — Z0000 Encounter for general adult medical examination without abnormal findings: Secondary | ICD-10-CM

## 2017-06-05 DIAGNOSIS — F329 Major depressive disorder, single episode, unspecified: Secondary | ICD-10-CM | POA: Insufficient documentation

## 2017-06-05 LAB — POCT UA - MICROALBUMIN
CREATININE, POC: 300 mg/dL
Microalbumin Ur, POC: 150 mg/L

## 2017-06-05 MED ORDER — FLUOXETINE HCL 20 MG PO CAPS: 20.0000 mg | ORAL_CAPSULE | Freq: Every day | ORAL | 0 refills | Status: AC

## 2017-06-05 MED ORDER — AMLODIPINE BESYLATE 10 MG PO TABS: 10.0000 mg | ORAL_TABLET | Freq: Every day | ORAL | 2 refills | Status: AC

## 2017-06-05 MED ORDER — LABETALOL HCL 300 MG PO TABS: 300.0000 mg | ORAL_TABLET | Freq: Two times a day (BID) | ORAL | 3 refills | Status: AC

## 2017-06-05 MED ORDER — POLYMYXIN B-TRIMETHOPRIM 10000-0.1 UNIT/ML-% OP SOLN: 1.0000 [drp] | OPHTHALMIC | 0 refills | Status: DC

## 2017-06-05 MED ORDER — HYDROCHLOROTHIAZIDE 25 MG PO TABS: 25.0000 mg | ORAL_TABLET | Freq: Every day | ORAL | 2 refills | Status: AC

## 2017-06-05 MED ORDER — FOLIC ACID 1 MG PO TABS: 1.0000 mg | ORAL_TABLET | Freq: Every day | ORAL | 6 refills | Status: AC

## 2017-06-05 MED ORDER — METOPROLOL SUCCINATE ER 25 MG PO TB24: 25.0000 mg | ORAL_TABLET | Freq: Every day | ORAL | 2 refills | Status: DC

## 2017-06-05 MED ORDER — THIAMINE HCL 100 MG PO TABS: 100.0000 mg | ORAL_TABLET | Freq: Every day | ORAL | 2 refills | Status: AC

## 2017-06-05 MED FILL — HYDROCHLOROTHIAZIDE 25 MG T: 25 | 30 days supply | Qty: 30 | Fill #0

## 2017-06-05 MED FILL — FOLIC ACID 1 MG TABLET: 1 | 30 days supply | Qty: 30 | Fill #0

## 2017-06-05 MED FILL — AMLODIPINE BESYLATE 10 MG T: 10 | 30 days supply | Qty: 30 | Fill #0

## 2017-06-05 MED FILL — FLUoxetine HCL 20 MG CAPS: 20 | 30 days supply | Qty: 30 | Fill #0

## 2017-06-05 MED FILL — METOPROLOL SUCC ER 25 MG TA: 25 | 30 days supply | Qty: 30 | Fill #0

## 2017-06-05 MED FILL — POLYMYXIN B/TMP EYE DROPS: 10000-0.1 | 7 days supply | Qty: 10 | Fill #0

## 2017-06-05 MED FILL — LABETALOL HCL 300 MG TABLET: 300 | 30 days supply | Qty: 60 | Fill #0

## 2017-06-05 NOTE — Progress Notes (Signed)
Patient is here for f/up HTN  Been with out med for 2 months

## 2017-06-05 NOTE — Progress Notes (Signed)
Subjective:  Patient ID: Douglas Browning, male    DOB: 30-Oct-1970  Age: 46 y.o. MRN: [id number]  CC: Hypertension   HPI STOKES RATTIGAN presents for hypertension.Marland Kitchen He is not exercising and is not adherent to low salt diet. He does not check BP at home. He reports being without his BP medications for 3 months. Cardiac symptoms none. Patient denies chest pain, chest pressure/discomfort, claudication, dyspnea, lower extremity edema, near-syncope and syncope. Cardiovascular risk factors: hypertension, sedentary lifestyle and alcohol use. Use of agents associated with hypertension: none. History of target organ damage: none.He also  complains of depression. He complains of depressed mood, fatigue and anger, and frustration. Onset was approximately 8 months ago, unchanged since that time.  He denies current suicidal and homicidal plan or intent.   He denies any family history of alcoholism. Possible organic causes contributing are: none.  Risk factors: alcoholism Previous treatment includes none. He is agreeable to speaking with LCSW and medication at this time. He c/o eye discharge. Worse in am with matting. Onset 8 months ago. Associated symptoms include redness. He also reports occasional flashes of light. He denies any blurred vision. He denies taking anything for symptoms.   Outpatient Medications Prior to Visit  Medication Sig Dispense Refill  . aspirin EC 81 MG tablet Take 1 tablet (81 mg total) by mouth daily. 90 tablet 2  . diphenhydrAMINE (BENADRYL) 25 MG tablet Take 25 mg by mouth every 6 (six) hours as needed for allergies.    Marland Kitchen amLODipine (NORVASC) 10 MG tablet Take 1 tablet (10 mg total) by mouth daily. 90 tablet 3  . chlordiazePOXIDE (LIBRIUM) 25 MG capsule 50mg  PO TID x 1D, then 25-50mg  PO BID X 1D, then 25-50mg  PO QD X 1D 10 capsule 0  . folic acid (FOLVITE) 1 MG tablet Take 1 tablet (1 mg total) by mouth daily. 30 tablet 2  . hydrochlorothiazide (HYDRODIURIL) 25 MG tablet Take 1 tablet  (25 mg total) by mouth daily. 90 tablet 2  . labetalol (NORMODYNE) 300 MG tablet Take 1 tablet (300 mg total) by mouth 2 (two) times daily. 60 tablet 3  . thiamine 100 MG tablet Take 1 tablet (100 mg total) by mouth daily. 30 tablet 2   No facility-administered medications prior to visit.     ROS Review of Systems  Constitutional: Negative.   Eyes: Positive for discharge and visual disturbance.  Respiratory: Negative.   Cardiovascular: Negative.   Gastrointestinal: Negative.   Psychiatric/Behavioral: Positive for behavioral problems (alcoholism ) and dysphoric mood.    Objective:  BP (!) 153/88 (BP Location: Left Arm, Patient Position: Sitting, Cuff Size: Normal)   Pulse (!) 101   Temp 98.8 F (37.1 C) (Oral)   Resp 18   Ht 6\' 1"  (1.854 m)   Wt 192 lb 3.2 oz (87.2 kg)   SpO2 98%   BMI 25.36 kg/m   BP/Weight 06/05/2017 11/16/2016 08/05/2016  Systolic BP 153 158 114  Diastolic BP 88 101 73  Wt. (Lbs) 192.2 - -  BMI 25.36 - -    Physical Exam  Constitutional: He appears well-developed and well-nourished.  Eyes: Pupils are equal, round, and reactive to light. Conjunctivae and EOM are normal. Right eye exhibits no discharge. Left eye exhibits no discharge. Scleral icterus (with glossy appearance) is present.  Neck: No JVD present.  Cardiovascular: Normal rate, regular rhythm, normal heart sounds and intact distal pulses.   Pulmonary/Chest: Effort normal and breath sounds normal.  Abdominal: Soft. Bowel sounds  are normal. There is no tenderness.  Skin: Skin is warm and dry.  Psychiatric: He exhibits a depressed mood. He expresses no homicidal and no suicidal ideation. He expresses no suicidal plans and no homicidal plans.  Nursing note and vitals reviewed.   Assessment & Plan:   Problem List Items Addressed This Visit      Cardiovascular and Mediastinum   Essential hypertension - Primary        Schedule BP recheck in 2 weeks with nurse.   If BP is greater than 90/60 (MAP  65 or greater) but not less than 130/80 may increase dose of metoprolol to 50 mg and recheck in another 2 weeks.    Follow up with PCP in 3 months.   Relevant Medications   labetalol (NORMODYNE) 300 MG tablet   hydrochlorothiazide (HYDRODIURIL) 25 MG tablet   amLODipine (NORVASC) 10 MG tablet   metoprolol succinate (TOPROL XL) 25 MG 24 hr tablet   Other Relevant Orders   POCT UA - Microalbumin (Completed)   CMP and Liver    Other Visit Diagnoses    Depression, unspecified depression type       Relevant Medications   FLUoxetine (PROZAC) 20 MG capsule   Alcohol dependence with unspecified alcohol-induced disorder (HCC)       LCSW unavailable.at this time. LCSW card and resources given for follow up.    Relevant Medications   thiamine 100 MG tablet   folic acid (FOLVITE) 1 MG tablet   Other Relevant Orders   Lipid Panel   CBC with Differential   CMP and Liver   Acute bacterial conjunctivitis of both eyes       Relevant Medications   trimethoprim-polymyxin b (POLYTRIM) ophthalmic solution   Visual disturbances       Vision acuity screen    Relevant Orders   Ambulatory referral to Ophthalmology   Healthcare maintenance       Relevant Orders   HIV antibody (with reflex)      Meds ordered this encounter  Medications  . trimethoprim-polymyxin b (POLYTRIM) ophthalmic solution    Sig: Place 1 drop into both eyes every 3 (three) hours while awake. For 7 days.    Dispense:  10 mL    Refill:  0    Order Specific Question:   Supervising Provider    Answer:   Quentin Angst [id number]  . labetalol (NORMODYNE) 300 MG tablet    Sig: Take 1 tablet (300 mg total) by mouth 2 (two) times daily.    Dispense:  60 tablet    Refill:  3    Order Specific Question:   Supervising Provider    Answer:   Quentin Angst [id number]  . hydrochlorothiazide (HYDRODIURIL) 25 MG tablet    Sig: Take 1 tablet (25 mg total) by mouth daily.    Dispense:  30 tablet    Refill:  2    Order  Specific Question:   Supervising Provider    Answer:   Quentin Angst [id number]  . amLODipine (NORVASC) 10 MG tablet    Sig: Take 1 tablet (10 mg total) by mouth daily.    Dispense:  30 tablet    Refill:  2    Order Specific Question:   Supervising Provider    Answer:   Quentin Angst [id number]  . thiamine 100 MG tablet    Sig: Take 1 tablet (100 mg total) by mouth daily.    Dispense:  30 tablet  Refill:  2    Order Specific Question:   Supervising Provider    Answer:   Quentin AngstJEGEDE, OLUGBEMIGA E [id number]]  . folic acid (FOLVITE) 1 MG tablet    Sig: Take 1 tablet (1 mg total) by mouth daily.    Dispense:  30 tablet    Refill:  6    Order Specific Question:   Supervising Provider    Answer:   Quentin AngstJEGEDE, OLUGBEMIGA E [id number]]  . FLUoxetine (PROZAC) 20 MG capsule    Sig: Take 1 capsule (20 mg total) by mouth daily.    Dispense:  60 capsule    Refill:  0    Order Specific Question:   Supervising Provider    Answer:   Quentin AngstJEGEDE, OLUGBEMIGA E [id number]]  . metoprolol succinate (TOPROL XL) 25 MG 24 hr tablet    Sig: Take 1 tablet (25 mg total) by mouth daily.    Dispense:  30 tablet    Refill:  2    Order Specific Question:   Supervising Provider    Answer:   Quentin AngstJEGEDE, OLUGBEMIGA E [id number]]    Follow-up: Return in about 2 weeks (around 06/19/2017) for BP check with [facility].   Lizbeth BarkMandesia R Jaimarie Rapozo FNP

## 2017-06-05 NOTE — Patient Instructions (Signed)

## 2017-06-06 LAB — LIPID PANEL
CHOLESTEROL TOTAL: 228 mg/dL — AB (ref 100–199)
Chol/HDL Ratio: 2.5 ratio (ref 0.0–5.0)
HDL: 90 mg/dL (ref >39)
LDL Calculated: 123 mg/dL — ABNORMAL HIGH (ref 0–99)
Triglycerides: 74 mg/dL (ref 0–149)
VLDL Cholesterol Cal: 15 mg/dL (ref 5–40)

## 2017-06-06 LAB — CBC WITH DIFFERENTIAL/PLATELET
BASOS: 1 %
Basophils Absolute: 0.1 10*3/uL (ref 0.0–0.2)
EOS (ABSOLUTE): 0 10*3/uL (ref 0.0–0.4)
EOS: 1 %
HEMATOCRIT: 46.3 % (ref 37.5–51.0)
Hemoglobin: 15.3 g/dL (ref 13.0–17.7)
IMMATURE GRANS (ABS): 0 10*3/uL (ref 0.0–0.1)
IMMATURE GRANULOCYTES: 0 %
Lymphocytes Absolute: 3.3 10*3/uL — ABNORMAL HIGH (ref 0.7–3.1)
Lymphs: 42 %
MCH: 32.7 pg (ref 26.6–33.0)
MCHC: 33 g/dL (ref 31.5–35.7)
MCV: 99 fL — AB (ref 79–97)
MONOS ABS: 0.4 10*3/uL (ref 0.1–0.9)
Monocytes: 5 %
NEUTROS PCT: 51 %
Neutrophils Absolute: 4.1 10*3/uL (ref 1.4–7.0)
PLATELETS: 324 10*3/uL (ref 150–379)
RBC: 4.68 x10E6/uL (ref 4.14–5.80)
RDW: 14.3 % (ref 12.3–15.4)
WBC: 7.9 10*3/uL (ref 3.4–10.8)

## 2017-06-06 LAB — HIV ANTIBODY (ROUTINE TESTING W REFLEX): HIV Screen 4th Generation wRfx: NONREACTIVE

## 2017-06-11 ENCOUNTER — Other Ambulatory Visit: Payer: Self-pay | Admitting: Family Medicine

## 2017-06-11 ENCOUNTER — Telehealth: Payer: Self-pay

## 2017-06-11 DIAGNOSIS — E782 Mixed hyperlipidemia: Secondary | ICD-10-CM

## 2017-06-11 MED ORDER — ATORVASTATIN CALCIUM 10 MG PO TABS: 10.0000 mg | ORAL_TABLET | Freq: Every day | ORAL | 2 refills | Status: AC

## 2017-06-11 MED FILL — ?ATORVASTATIN 10 MG TABLET: 10 | 30 days supply | Qty: 30 | Fill #0

## 2017-06-11 NOTE — Telephone Encounter (Signed)
-----   Message from Lizbeth BarkMandesia R Hairston, FNP sent at 06/11/2017  7:17 AM EDT ----- HIV is negative.  Lipid levels were elevated. This can increase your risk of heart disease overtime. You will be prescribed atorvastatin. Start eating a diet low in saturated fat. Limit your intake of fried foods, red meats, and whole milk. Increased activity. Labs that evaluated your blood cells is normal. No signs of anemia.  Recommend follow up in 3 months

## 2017-06-11 NOTE — Telephone Encounter (Signed)
CMA call regarding lab results   Patient verify DOB  Patient was aware and understood  

## 2017-06-19 ENCOUNTER — Observation Stay (HOSPITAL_COMMUNITY)
Admission: EM | Admit: 2017-06-19 | Discharge: 2017-06-20 | Disposition: A | Discharge status: Home / Self Care | Payer: Self-pay | Attending: Internal Medicine | Admitting: Internal Medicine

## 2017-06-19 ENCOUNTER — Encounter (HOSPITAL_COMMUNITY): Payer: Self-pay

## 2017-06-19 ENCOUNTER — Emergency Department (HOSPITAL_COMMUNITY): Payer: Self-pay

## 2017-06-19 DIAGNOSIS — F1010 Alcohol abuse, uncomplicated: Secondary | ICD-10-CM | POA: Diagnosis present

## 2017-06-19 DIAGNOSIS — R0789 Other chest pain: Secondary | ICD-10-CM | POA: Insufficient documentation

## 2017-06-19 DIAGNOSIS — F10230 Alcohol dependence with withdrawal, uncomplicated: Secondary | ICD-10-CM | POA: Insufficient documentation

## 2017-06-19 DIAGNOSIS — I10 Essential (primary) hypertension: Secondary | ICD-10-CM | POA: Insufficient documentation

## 2017-06-19 DIAGNOSIS — F10930 Alcohol use, unspecified with withdrawal, uncomplicated: Secondary | ICD-10-CM

## 2017-06-19 DIAGNOSIS — E876 Hypokalemia: Secondary | ICD-10-CM | POA: Insufficient documentation

## 2017-06-19 DIAGNOSIS — F329 Major depressive disorder, single episode, unspecified: Secondary | ICD-10-CM | POA: Insufficient documentation

## 2017-06-19 DIAGNOSIS — F10929 Alcohol use, unspecified with intoxication, unspecified: Secondary | ICD-10-CM | POA: Diagnosis present

## 2017-06-19 DIAGNOSIS — L03313 Cellulitis of chest wall: Secondary | ICD-10-CM

## 2017-06-19 DIAGNOSIS — K760 Fatty (change of) liver, not elsewhere classified: Secondary | ICD-10-CM | POA: Insufficient documentation

## 2017-06-19 DIAGNOSIS — E872 Acidosis, unspecified: Secondary | ICD-10-CM | POA: Diagnosis present

## 2017-06-19 DIAGNOSIS — Z7982 Long term (current) use of aspirin: Secondary | ICD-10-CM | POA: Insufficient documentation

## 2017-06-19 DIAGNOSIS — N61 Mastitis without abscess: Secondary | ICD-10-CM | POA: Insufficient documentation

## 2017-06-19 DIAGNOSIS — E785 Hyperlipidemia, unspecified: Secondary | ICD-10-CM | POA: Insufficient documentation

## 2017-06-19 DIAGNOSIS — Z79899 Other long term (current) drug therapy: Secondary | ICD-10-CM | POA: Insufficient documentation

## 2017-06-19 DIAGNOSIS — F10229 Alcohol dependence with intoxication, unspecified: Principal | ICD-10-CM | POA: Insufficient documentation

## 2017-06-19 DIAGNOSIS — Z720 Tobacco use: Secondary | ICD-10-CM

## 2017-06-19 DIAGNOSIS — Z888 Allergy status to other drugs, medicaments and biological substances status: Secondary | ICD-10-CM | POA: Insufficient documentation

## 2017-06-19 DIAGNOSIS — F17210 Nicotine dependence, cigarettes, uncomplicated: Secondary | ICD-10-CM | POA: Insufficient documentation

## 2017-06-19 HISTORY — DX: Major depressive disorder, single episode, unspecified: F32.9

## 2017-06-19 HISTORY — DX: Depression, unspecified: F32.A

## 2017-06-19 HISTORY — DX: Alcohol abuse, uncomplicated: F10.10

## 2017-06-19 LAB — COMPREHENSIVE METABOLIC PANEL
ALK PHOS: 67 U/L (ref 38–126)
ALT: 105 U/L — ABNORMAL HIGH (ref 17–63)
ANION GAP: 19 — AB (ref 5–15)
AST: 153 U/L — ABNORMAL HIGH (ref 15–41)
Albumin: 4.9 g/dL (ref 3.5–5.0)
BILIRUBIN TOTAL: 1 mg/dL (ref 0.3–1.2)
BUN: 8 mg/dL (ref 6–20)
CALCIUM: 9.6 mg/dL (ref 8.9–10.3)
CO2: 24 mmol/L (ref 22–32)
Chloride: 95 mmol/L — ABNORMAL LOW (ref 101–111)
Creatinine, Ser: 0.7 mg/dL (ref 0.61–1.24)
GFR calc Af Amer: >60 mL/min (ref >60)
Glucose, Bld: 101 mg/dL — ABNORMAL HIGH (ref 65–99)
POTASSIUM: 3.6 mmol/L (ref 3.5–5.1)
Sodium: 138 mmol/L (ref 135–145)
TOTAL PROTEIN: 8.7 g/dL — AB (ref 6.5–8.1)

## 2017-06-19 LAB — URINALYSIS, ROUTINE W REFLEX MICROSCOPIC
BILIRUBIN URINE: NEGATIVE
GLUCOSE, UA: NEGATIVE mg/dL
KETONES UR: 80 mg/dL — AB
LEUKOCYTES UA: NEGATIVE
Nitrite: NEGATIVE
PH: 6 (ref 5.0–8.0)
Protein, ur: 100 mg/dL — AB
Specific Gravity, Urine: 1.02 (ref 1.005–1.030)

## 2017-06-19 LAB — RAPID URINE DRUG SCREEN, HOSP PERFORMED
Amphetamines: NOT DETECTED
BARBITURATES: NOT DETECTED
BENZODIAZEPINES: NOT DETECTED
Cocaine: NOT DETECTED
Opiates: NOT DETECTED
Tetrahydrocannabinol: NOT DETECTED

## 2017-06-19 LAB — CBC WITH DIFFERENTIAL/PLATELET
BASOS ABS: 0 10*3/uL (ref 0.0–0.1)
Basophils Relative: 1 %
EOS ABS: 0 10*3/uL (ref 0.0–0.7)
EOS PCT: 1 %
HCT: 44.5 % (ref 39.0–52.0)
Hemoglobin: 15.5 g/dL (ref 13.0–17.0)
Lymphocytes Relative: 33 %
Lymphs Abs: 1.9 10*3/uL (ref 0.7–4.0)
MCH: 32.5 pg (ref 26.0–34.0)
MCHC: 34.8 g/dL (ref 30.0–36.0)
MCV: 93.3 fL (ref 78.0–100.0)
Monocytes Absolute: 0.8 10*3/uL (ref 0.1–1.0)
Monocytes Relative: 13 %
Neutro Abs: 2.9 10*3/uL (ref 1.7–7.7)
Neutrophils Relative %: 52 %
PLATELETS: 174 10*3/uL (ref 150–400)
RBC: 4.77 MIL/uL (ref 4.22–5.81)
RDW: 12.9 % (ref 11.5–15.5)
WBC: 5.6 10*3/uL (ref 4.0–10.5)

## 2017-06-19 LAB — I-STAT CG4 LACTIC ACID, ED
LACTIC ACID, VENOUS: 3.59 mmol/L — AB (ref 0.5–1.9)
Lactic Acid, Venous: 4.5 mmol/L (ref 0.5–1.9)

## 2017-06-19 LAB — LACTIC ACID, PLASMA: LACTIC ACID, VENOUS: 1.7 mmol/L (ref 0.5–1.9)

## 2017-06-19 LAB — ETHANOL: ALCOHOL ETHYL (B): 129 mg/dL — AB (ref <5)

## 2017-06-19 MED ORDER — LORAZEPAM 2 MG/ML IJ SOLN
1.0000 mg | Freq: Four times a day (QID) | INTRAMUSCULAR | Status: DC | PRN
  Administered 2017-06-20: 1 mg via INTRAVENOUS
  Filled 2017-06-19: qty 1

## 2017-06-19 MED ORDER — IOPAMIDOL (ISOVUE-300) INJECTION 61%
INTRAVENOUS | Status: AC
  Filled 2017-06-19: qty 75

## 2017-06-19 MED ORDER — ACETAMINOPHEN 650 MG RE SUPP: 650.0000 mg | Freq: Four times a day (QID) | RECTAL | Status: DC | PRN

## 2017-06-19 MED ORDER — ADULT MULTIVITAMIN W/MINERALS CH
1.0000 | ORAL_TABLET | Freq: Every day | ORAL | Status: DC
  Administered 2017-06-19 – 2017-06-20 (×2): 1 via ORAL
  Filled 2017-06-19 (×2): qty 1

## 2017-06-19 MED ORDER — ATORVASTATIN CALCIUM 10 MG PO TABS
10.0000 mg | ORAL_TABLET | Freq: Every day | ORAL | Status: DC
  Administered 2017-06-19 – 2017-06-20 (×2): 10 mg via ORAL
  Filled 2017-06-19 (×2): qty 1

## 2017-06-19 MED ORDER — NICOTINE 21 MG/24HR TD PT24
21.0000 mg | MEDICATED_PATCH | Freq: Every day | TRANSDERMAL | Status: DC
  Administered 2017-06-19 – 2017-06-20 (×2): 21 mg via TRANSDERMAL
  Filled 2017-06-19 (×2): qty 1

## 2017-06-19 MED ORDER — LORAZEPAM 1 MG PO TABS: 1.0000 mg | ORAL_TABLET | Freq: Four times a day (QID) | ORAL | Status: DC | PRN

## 2017-06-19 MED ORDER — ASPIRIN EC 81 MG PO TBEC
81.0000 mg | DELAYED_RELEASE_TABLET | Freq: Every day | ORAL | Status: DC
  Administered 2017-06-19 – 2017-06-20 (×2): 81 mg via ORAL
  Filled 2017-06-19 (×2): qty 1

## 2017-06-19 MED ORDER — LORAZEPAM 1 MG PO TABS: 0.0000 mg | ORAL_TABLET | Freq: Two times a day (BID) | ORAL | Status: DC

## 2017-06-19 MED ORDER — SODIUM CHLORIDE 0.9 % IV BOLUS (SEPSIS)
1000.0000 mL | Freq: Once | INTRAVENOUS | Status: AC
  Administered 2017-06-19: 1000 mL via INTRAVENOUS

## 2017-06-19 MED ORDER — FLUOXETINE HCL 20 MG PO CAPS
20.0000 mg | ORAL_CAPSULE | Freq: Every day | ORAL | Status: DC
  Administered 2017-06-19 – 2017-06-20 (×2): 20 mg via ORAL
  Filled 2017-06-19 (×2): qty 1

## 2017-06-19 MED ORDER — THIAMINE HCL 100 MG/ML IJ SOLN
Freq: Once | INTRAVENOUS | Status: AC
  Administered 2017-06-19: 09:00:00 via INTRAVENOUS
  Filled 2017-06-19: qty 1000

## 2017-06-19 MED ORDER — CLINDAMYCIN PHOSPHATE 600 MG/50ML IV SOLN
600.0000 mg | Freq: Once | INTRAVENOUS | Status: AC
  Administered 2017-06-19: 600 mg via INTRAVENOUS
  Filled 2017-06-19: qty 50

## 2017-06-19 MED ORDER — IOPAMIDOL (ISOVUE-300) INJECTION 61%
75.0000 mL | Freq: Once | INTRAVENOUS | Status: AC | PRN
  Administered 2017-06-19: 75 mL via INTRAVENOUS

## 2017-06-19 MED ORDER — VITAMIN B-1 100 MG PO TABS
100.0000 mg | ORAL_TABLET | Freq: Every day | ORAL | Status: DC
  Administered 2017-06-19 – 2017-06-20 (×2): 100 mg via ORAL
  Filled 2017-06-19 (×2): qty 1

## 2017-06-19 MED ORDER — FOLIC ACID 1 MG PO TABS
1.0000 mg | ORAL_TABLET | Freq: Every day | ORAL | Status: DC
  Administered 2017-06-19 – 2017-06-20 (×2): 1 mg via ORAL
  Filled 2017-06-19 (×2): qty 1

## 2017-06-19 MED ORDER — ONDANSETRON HCL 4 MG/2ML IJ SOLN: 4.0000 mg | Freq: Four times a day (QID) | INTRAMUSCULAR | Status: DC | PRN

## 2017-06-19 MED ORDER — ACETAMINOPHEN 325 MG PO TABS: 650.0000 mg | ORAL_TABLET | Freq: Four times a day (QID) | ORAL | Status: DC | PRN

## 2017-06-19 MED ORDER — ENOXAPARIN SODIUM 40 MG/0.4ML ~~LOC~~ SOLN: 40.0000 mg | SUBCUTANEOUS | Status: DC

## 2017-06-19 MED ORDER — LORAZEPAM 1 MG PO TABS
1.0000 mg | ORAL_TABLET | Freq: Once | ORAL | Status: AC
  Administered 2017-06-19: 1 mg via ORAL
  Filled 2017-06-19: qty 1

## 2017-06-19 MED ORDER — ONDANSETRON HCL 4 MG PO TABS: 4.0000 mg | ORAL_TABLET | Freq: Four times a day (QID) | ORAL | Status: DC | PRN

## 2017-06-19 MED ORDER — AMLODIPINE BESYLATE 5 MG PO TABS
10.0000 mg | ORAL_TABLET | Freq: Every day | ORAL | Status: DC
  Administered 2017-06-19 – 2017-06-20 (×2): 10 mg via ORAL
  Filled 2017-06-19 (×2): qty 2

## 2017-06-19 MED ORDER — HYDROCHLOROTHIAZIDE 25 MG PO TABS
25.0000 mg | ORAL_TABLET | Freq: Every day | ORAL | Status: DC
  Administered 2017-06-19 – 2017-06-20 (×2): 25 mg via ORAL
  Filled 2017-06-19 (×2): qty 1

## 2017-06-19 MED ORDER — SODIUM CHLORIDE 0.9 % IV SOLN
INTRAVENOUS | Status: DC
  Administered 2017-06-19 – 2017-06-20 (×2): via INTRAVENOUS

## 2017-06-19 MED ORDER — AMOXICILLIN-POT CLAVULANATE 875-125 MG PO TABS
1.0000 | ORAL_TABLET | Freq: Two times a day (BID) | ORAL | Status: DC
  Administered 2017-06-19 – 2017-06-20 (×2): 1 via ORAL
  Filled 2017-06-19 (×2): qty 1

## 2017-06-19 MED ORDER — LABETALOL HCL 300 MG PO TABS
300.0000 mg | ORAL_TABLET | Freq: Two times a day (BID) | ORAL | Status: DC
  Administered 2017-06-19 – 2017-06-20 (×2): 300 mg via ORAL
  Filled 2017-06-19 (×2): qty 1

## 2017-06-19 MED ORDER — LORAZEPAM 1 MG PO TABS
0.0000 mg | ORAL_TABLET | Freq: Four times a day (QID) | ORAL | Status: DC
  Administered 2017-06-19: 1 mg via ORAL
  Administered 2017-06-19: 2 mg via ORAL
  Administered 2017-06-20: 1 mg via ORAL
  Filled 2017-06-19: qty 1
  Filled 2017-06-19: qty 2
  Filled 2017-06-19: qty 1

## 2017-06-19 NOTE — ED Notes (Signed)
Victorino DikeJennifer [phone] accepting RN. Scheduled report [phone]

## 2017-06-19 NOTE — ED Triage Notes (Signed)
Per EMS- Patient's wife called for SOB, but upon arrival the patient was intoxicated. Patient's wife reports that the patient has been drinking heavy x 3 days. Patient had swelling to the left chest area and was tender to touch.

## 2017-06-19 NOTE — H&P (Signed)
History and Physical  Douglas Browning ZOX:[id number] DOB: October 02, 1971 DOA: 06/19/2017  Referring physician:  Frederick Peers, ER physician PCP: Lizbeth Bark, FNP  Outpatient Specialists: None Patient coming from: Home & is able to ambulate without assistance  Chief Complaint: Shakes   HPI: Douglas Browning is a 46 y.o. male with medical history significant for heavy alcohol abuse who came to the emergency room today, 9/12. Likely left-sided chest pain and swelling as well as rigors  ED Course: In the emergency room, patient was afebrile and had a normal white count. He was tremulous and there was a question of whether not this was attributable to alcohol. Patient's last drink was the night prior. Patient drinks about 2 pints of hard liquor on most days and when he is not drinking hard liquor, he drinks beer. Patient's lactic acid level was noted to be 4.5.  Urine and chest x-ray unrevealing.  Given complaints of chest wall pain, swelling and tenderness, emergency room did a CT scan of chest and patient was found to have cellulitis of the left breast. Patient was given IV antibiotics and hospitalist will call for further evaluation and admission. After fluids, repeat lactic acid level checked at noon and found to be lower, but still elevated at 3.6.  Review of Systems: Patient seen after arrival to floor . Pt complains of tremors, some mild tenderness over his left breast although less so along with less swelling.   Pt denies any headaches, vision changes, dysphagia, palpitations, shortness of breath, wheeze, cough, abdominal pain, hematuria, dysuria, constipation, diarrhea, focal extremity numbness weakness or pain .  Review of systems are otherwise negative   Past Medical History:  Diagnosis Date  . Depression   . ETOH abuse   . Hypertension    Past Surgical History:  Procedure Laterality Date  . TRACHEOSTOMY     due to allergic reaction to Lisinopril    Social History:  reports  that he has been smoking Cigarettes.  He has been smoking about 1.00 pack per day. He has never used smokeless tobacco. He reports that he drinks alcohol. He reports that he does not use drugs.  Ambulates without assistance, lives at home with his wife   Allergies  Allergen Reactions  . Lisinopril Swelling    angioedema    Family History  Problem Relation Age of Onset  . Hypertension Mother       Prior to Admission medications   Medication Sig Start Date End Date Taking? Authorizing Provider  amLODipine (NORVASC) 10 MG tablet Take 1 tablet (10 mg total) by mouth daily. 06/05/17  Yes Lizbeth Bark, FNP  aspirin EC 81 MG tablet Take 1 tablet (81 mg total) by mouth daily. 02/14/16  Yes Langeland, Dawn T, MD  atorvastatin (LIPITOR) 10 MG tablet Take 1 tablet (10 mg total) by mouth daily. 06/11/17  Yes Hairston, Oren Beckmann, FNP  FLUoxetine (PROZAC) 20 MG capsule Take 1 capsule (20 mg total) by mouth daily. 06/05/17  Yes Hairston, Oren Beckmann, FNP  folic acid (FOLVITE) 1 MG tablet Take 1 tablet (1 mg total) by mouth daily. 06/05/17  Yes Hairston, Oren Beckmann, FNP  hydrochlorothiazide (HYDRODIURIL) 25 MG tablet Take 1 tablet (25 mg total) by mouth daily. 06/05/17  Yes Hairston, Oren Beckmann, FNP  ibuprofen (ADVIL,MOTRIN) 200 MG tablet Take 400 mg by mouth every 6 (six) hours as needed for mild pain.   Yes [provider]  labetalol (NORMODYNE) 300 MG tablet Take 1 tablet (300 mg  total) by mouth 2 (two) times daily. 06/05/17  Yes Hairston, Oren BeckmannMandesia R, FNP  metoprolol succinate (TOPROL XL) 25 MG 24 hr tablet Take 1 tablet (25 mg total) by mouth daily. 06/05/17  Yes Hairston, Leonia ReevesMandesia R, FNP  thiamine 100 MG tablet Take 1 tablet (100 mg total) by mouth daily. 06/05/17  Yes Lizbeth BarkHairston, Mandesia R, FNP    Physical Exam: BP (!) 150/88   Pulse (!) 101   Temp 98.9 F (37.2 C) (Oral)   Resp (!) 22   Ht 6\' 1"  (1.854 m)   Wt 89.8 kg (198 lb)   SpO2 98%   BMI 26.12 kg/m   General:  Alert and  oriented 3, no acute distress, tremulous  Eyes: Sclera mildly icteric, extraocular movements are intact  ENT: Normocephalic, atraumatic, mucous remains are slightly dry  Neck: Supple, no JVD  Cardiovascular: Regular rhythm, borderline tachycardia  Respiratory: Clear to auscultation bilaterally  Abdomen: Soft, nontender, nondistended, positive bowel sounds  Skin: Area over left breast notes some mild erythema-difficult to assess because of patient's darker skin, however there is some tenderness and swelling Musculoskeletal: No clubbing or cyanosis or edema  Psychiatric: Patient is appropriate, no evidence of psychoses  Neurologic: No focal deficits other than tremors           Labs on Admission:  Basic Metabolic Panel:  Recent Labs Lab 06/19/17 0855  NA 138  K 3.6  CL 95*  CO2 24  GLUCOSE 101*  BUN 8  CREATININE 0.70  CALCIUM 9.6   Liver Function Tests:  Recent Labs Lab 06/19/17 0855  AST 153*  ALT 105*  ALKPHOS 67  BILITOT 1.0  PROT 8.7*  ALBUMIN 4.9   No results for input(s): LIPASE, AMYLASE in the last 168 hours. No results for input(s): AMMONIA in the last 168 hours. CBC:  Recent Labs Lab 06/19/17 0855  WBC 5.6  NEUTROABS 2.9  HGB 15.5  HCT 44.5  MCV 93.3  PLT 174   Cardiac Enzymes: No results for input(s): CKTOTAL, CKMB, CKMBINDEX, TROPONINI in the last 168 hours.  BNP (last 3 results) No results for input(s): BNP in the last 8760 hours.  ProBNP (last 3 results) No results for input(s): PROBNP in the last 8760 hours.  CBG: No results for input(s): GLUCAP in the last 168 hours.  Radiological Exams on Admission: Dg Chest 2 View  Result Date: 06/19/2017 CLINICAL DATA:  Left anterior chest pain and swelling. No known trauma. EXAM: CHEST  2 VIEW COMPARISON:  January 25, 2016. FINDINGS: The cardiomediastinal silhouette is normal in size. Normal pulmonary vascularity. No focal consolidation, pleural effusion, or pneumothorax. No acute osseous  abnormality. IMPRESSION: No active cardiopulmonary disease. Electronically Signed   By: Obie DredgeWilliam T Derry M.D.   On: 06/19/2017 08:56   Ct Chest W Contrast  Result Date: 06/19/2017 CLINICAL DATA:  Shortness of breath. Intoxication. Left chest tenderness to touch. EXAM: CT CHEST WITH CONTRAST TECHNIQUE: Multidetector CT imaging of the chest was performed during intravenous contrast administration. CONTRAST:  75mL ISOVUE-300 IOPAMIDOL (ISOVUE-300) INJECTION 61% COMPARISON:  None. FINDINGS: Cardiovascular: Normal heart size and mediastinal contours. No acute vascular finding. Mediastinum/Nodes: Negative for adenopathy or mass. Lungs/Pleura: Some mucus is seen within the central airways. There is no edema, consolidation, effusion, or pneumothorax. Calcified granuloma in the right upper lobe. Upper Abdomen: Prominent hepatic steatosis. Exophytic simple appearing cyst from the upper pole left kidney measuring 34 mm. Musculoskeletal: There is skin thickening and subcutaneous stranding along the left anterior chest wall. Negative  for abscess. No acute or aggressive osseous finding. IMPRESSION: 1. Cellulitic changes at the left anterior chest wall. Negative for abscess. 2. No acute intrathoracic finding. 3. Hepatic steatosis. Electronically Signed   By: Marnee Spring M.D.   On: 06/19/2017 11:03    EKG: Not done  Assessment/Plan Present on Admission: . Essential hypertension: Continue home medications . Alcohol abuse/acute alcohol intoxication: IV fluids, thiamine and folate and multivitamin. Counseled patient. Wife really once him to quit. ciwa protocol . Cellulitis of breast of male: Stable. Likely from ingrown hair. No signs of any open cuts or lesions. Aquatics. Stable. Given normal white count and no fever . Tobacco abuse: Nicotine patch. Counseled patient to quit. Lactic acidosis: patient is not septic despite finding of infection. Suspect lactic acid level much more related to intravascular volume  depletion from heavy alcohol drinking. Aggressively hydrate, follow level.  Active Problems:   Essential hypertension   Alcohol abuse   Cellulitis of breast of male   Acute alcohol intoxication (HCC)   Lactic acidosis   DVT prophylaxis: Lovenox   Code Status: Full code   Family Communication: Wife at the bedside   Disposition Plan: Potential discharge tomorrow if lactic acidosis resolved   Consults called: None   Admission status: Given potential discharge for tomorrow, placed under observation     Hollice Espy MD [facility] Pager [phone]  If 7PM-7AM, please contact night-coverage [email or web address] Password [facility]  06/19/2017, 6:49 PM

## 2017-06-19 NOTE — ED Notes (Signed)
RN will start IV

## 2017-06-19 NOTE — ED Notes (Signed)
Patient given lunch tray.

## 2017-06-19 NOTE — ED Provider Notes (Signed)
[facility]-EMERGENCY DEPT Provider Note   CSN: [id number] Arrival date & time: 06/19/17  0737     History   Chief Complaint Chief Complaint  Patient presents with  . Alcohol Intoxication    HPI Douglas Browning is a 46 y.o. male.  46 year old male with past medical history including hypertension, depression, alcohol abuse who presents with alcohol intoxication. The patient's wife called EMS this morning reportedly for shortness of breath but when they arrived they felt that the patient was intoxicated. She reported that he has been drinking heavily for the past 3 days. The patient himself states that he drinks approximately 1 pint of liquor daily. He states that his last drink was yesterday around 6 AM. He has been shaky since then but he denies any nausea or vomiting. [place] report 1 day history of pain in his left chest at his breast area that is tender to touch. He denies any known falls or trauma in this area. No breathing problems, abdominal pain, or other complaints. He reports problems with depression but denies any SI or hallucinations.    The history is provided by the patient.  Alcohol Intoxication     Past Medical History:  Diagnosis Date  . Depression   . ETOH abuse   . Hypertension     Patient Active Problem List   Diagnosis Date Noted  . Endotracheally intubated   . Angioedema 01/24/2016  . Acute respiratory failure with hypoxia (HCC)   . Essential hypertension   . Hypokalemia   . Alcohol abuse     Past Surgical History:  Procedure Laterality Date  . TRACHEOSTOMY     due to allergic reaction to Lisinopril       Home Medications    Prior to Admission medications   Medication Sig Start Date End Date Taking? Authorizing Provider  amLODipine (NORVASC) 10 MG tablet Take 1 tablet (10 mg total) by mouth daily. 06/05/17   Lizbeth BarkHairston, Mandesia R, FNP  aspirin EC 81 MG tablet Take 1 tablet (81 mg total) by mouth daily. 02/14/16   Pete GlatterLangeland, Dawn T, MD  atorvastatin  (LIPITOR) 10 MG tablet Take 1 tablet (10 mg total) by mouth daily. 06/11/17   Lizbeth BarkHairston, Mandesia R, FNP  diphenhydrAMINE (BENADRYL) 25 MG tablet Take 25 mg by mouth every 6 (six) hours as needed for allergies.    [provider]  FLUoxetine (PROZAC) 20 MG capsule Take 1 capsule (20 mg total) by mouth daily. 06/05/17   Lizbeth BarkHairston, Mandesia R, FNP  folic acid (FOLVITE) 1 MG tablet Take 1 tablet (1 mg total) by mouth daily. 06/05/17   Lizbeth BarkHairston, Mandesia R, FNP  hydrochlorothiazide (HYDRODIURIL) 25 MG tablet Take 1 tablet (25 mg total) by mouth daily. 06/05/17   Lizbeth BarkHairston, Mandesia R, FNP  labetalol (NORMODYNE) 300 MG tablet Take 1 tablet (300 mg total) by mouth 2 (two) times daily. 06/05/17   Lizbeth BarkHairston, Mandesia R, FNP  metoprolol succinate (TOPROL XL) 25 MG 24 hr tablet Take 1 tablet (25 mg total) by mouth daily. 06/05/17   Lizbeth BarkHairston, Mandesia R, FNP  thiamine 100 MG tablet Take 1 tablet (100 mg total) by mouth daily. 06/05/17   Lizbeth BarkHairston, Mandesia R, FNP  trimethoprim-polymyxin b (POLYTRIM) ophthalmic solution Place 1 drop into both eyes every 3 (three) hours while awake. For 7 days. 06/05/17   Lizbeth BarkHairston, Mandesia R, FNP    Family History Family History  Problem Relation Age of Onset  . Hypertension Mother     Social History Social History  Substance  Use Topics  . Smoking status: Current Every Day Smoker    Packs/day: 1.00    Types: Cigarettes  . Smokeless tobacco: Never Used  . Alcohol use Yes     Comment: daily intake- fifth of liquor     Allergies   Lisinopril   Review of Systems Review of Systems All other systems reviewed and are negative except that which was mentioned in HPI   Physical Exam Updated Vital Signs BP 138/88 (BP Location: Left Arm)   Pulse 92   Temp 98.8 F (37.1 C) (Oral)   Resp 18   Ht  (1.854 m)   SpO2 98%   Physical Exam  Constitutional: He is oriented to person, place, and time. He appears well-developed and well-nourished. No distress.  HENT:    Head: Normocephalic and atraumatic.  Moist mucous membranes  Eyes: Pupils are equal, round, and reactive to light.  B/l conjunctival injection  Neck: Neck supple.  Cardiovascular: Normal rate, regular rhythm and normal heart sounds.   No murmur heard. Pulmonary/Chest: Effort normal and breath sounds normal. He exhibits tenderness (L breast tenderness with mild swelling).  Abdominal: Soft. Bowel sounds are normal. He exhibits no distension. There is no tenderness.  Musculoskeletal: He exhibits no edema.  Neurological: He is alert and oriented to person, place, and time.  Fluent speech, tremulous but otherwise normal finger-to-nose testing  Skin: Skin is warm and dry.  L breast mildly swollen and tender with no skin changes or warmth  Psychiatric: He has a normal mood and affect. Judgment normal.  Nursing note and vitals reviewed.    ED Treatments / Results  Labs (all labs ordered are listed, but only abnormal results are displayed) Labs Reviewed  COMPREHENSIVE METABOLIC PANEL - Abnormal; Notable for the following:       Result Value   Chloride 95 (*)    Glucose, Bld 101 (*)    Total Protein 8.7 (*)    AST 153 (*)    ALT 105 (*)    Anion gap 19 (*)    All other components within normal limits  ETHANOL - Abnormal; Notable for the following:    Alcohol, Ethyl (B) 129 (*)    All other components within normal limits  URINALYSIS, ROUTINE W REFLEX MICROSCOPIC - Abnormal; Notable for the following:    Hgb urine dipstick SMALL (*)    Ketones, ur 80 (*)    Protein, ur 100 (*)    Bacteria, UA FEW (*)    Squamous Epithelial / LPF 0-5 (*)    All other components within normal limits  I-STAT CG4 LACTIC ACID, ED - Abnormal; Notable for the following:    Lactic Acid, Venous 4.50 (*)    All other components within normal limits  I-STAT CG4 LACTIC ACID, ED - Abnormal; Notable for the following:    Lactic Acid, Venous 3.59 (*)    All other components within normal limits  CULTURE,  BLOOD (ROUTINE X 2)  CULTURE, BLOOD (ROUTINE X 2)  CBC WITH DIFFERENTIAL/PLATELET  RAPID URINE DRUG SCREEN, HOSP PERFORMED  LACTIC ACID, PLASMA  I-STAT CG4 LACTIC ACID, ED  I-STAT CG4 LACTIC ACID, ED    EKG  EKG Interpretation None       Radiology Dg Chest 2 View  Result Date: 06/19/2017 CLINICAL DATA:  Left anterior chest pain and swelling. No known trauma. EXAM: CHEST  2 VIEW COMPARISON:  January 25, 2016. FINDINGS: The cardiomediastinal silhouette is normal in size. Normal pulmonary vascularity. No focal  consolidation, pleural effusion, or pneumothorax. No acute osseous abnormality. IMPRESSION: No active cardiopulmonary disease. Electronically Signed   By: Obie Dredge M.D.   On: 06/19/2017 08:56   Ct Chest W Contrast  Result Date: 06/19/2017 CLINICAL DATA:  Shortness of breath. Intoxication. Left chest tenderness to touch. EXAM: CT CHEST WITH CONTRAST TECHNIQUE: Multidetector CT imaging of the chest was performed during intravenous contrast administration. CONTRAST:  75mL ISOVUE-300 IOPAMIDOL (ISOVUE-300) INJECTION 61% COMPARISON:  None. FINDINGS: Cardiovascular: Normal heart size and mediastinal contours. No acute vascular finding. Mediastinum/Nodes: Negative for adenopathy or mass. Lungs/Pleura: Some mucus is seen within the central airways. There is no edema, consolidation, effusion, or pneumothorax. Calcified granuloma in the right upper lobe. Upper Abdomen: Prominent hepatic steatosis. Exophytic simple appearing cyst from the upper pole left kidney measuring 34 mm. Musculoskeletal: There is skin thickening and subcutaneous stranding along the left anterior chest wall. Negative for abscess. No acute or aggressive osseous finding. IMPRESSION: 1. Cellulitic changes at the left anterior chest wall. Negative for abscess. 2. No acute intrathoracic finding. 3. Hepatic steatosis. Electronically Signed   By: Marnee Spring M.D.   On: 06/19/2017 11:03    Procedures Procedures (including  critical care time)  Medications Ordered in ED Medications  sodium chloride 0.9 % 1,000 mL with thiamine 100 mg, folic acid 1 mg, multivitamins adult 10 mL infusion (not administered)     Initial Impression / Assessment and Plan / ED Course  I have reviewed the triage vital signs and the nursing notes.  Pertinent labs & imaging results that were available during my care of the patient were reviewed by me and considered in my medical decision making (see chart for details).     PT w/ alcohol abuse brought in by EMS, wife concerned about SOB but patient complaining only of L chest wall pain. VS stable on arrival, afebrile, no tachycardia, mild HTN. He was mildly tremulous but mentating appropriately. L chest swelling and tenderness predominantly over left breast without fluctuance or skin changes. Obtained labs which showed lactate 4.5, mildly elevated LFTs, AG 19 likely 2/2 alcoholic ketoacidosis. EtOH 129. Gave multiple fluid boluses and banana bag, as well as oral ativan. CXR negative, obtained CT chest to evaluate swelling.  CT with cellulitic changes. Gave clindamycin. His lactate is persistently elevated at 3.6. I feel it is unlikely to be due to seizure activity or sepsis given normal VS and WBC count. Because of infection and ongoing lactic acidosis with high risk of serious alcohol withdrawal, discussed admission with Dr. Rito Ehrlich and pt admitted for further treatment.   Final Clinical Impressions(s) / ED Diagnoses   Final diagnoses:  Lactic acidosis  Alcohol withdrawal syndrome without complication (HCC)  Cellulitis of chest wall    New Prescriptions New Prescriptions   No medications on file     Amanada Philbrick, Ambrose Finland, MD 06/19/17 2159

## 2017-06-19 NOTE — ED Notes (Signed)
Patient transported to CT 

## 2017-06-19 NOTE — ED Notes (Signed)
EDP and RN notified of lactic

## 2017-06-19 NOTE — ED Notes (Addendum)
Pt is in hall at the moment. Cannot place on cardiac monitoring

## 2017-06-19 NOTE — ED Notes (Signed)
Patient transported to X-ray 

## 2017-06-20 DIAGNOSIS — N61 Mastitis without abscess: Secondary | ICD-10-CM

## 2017-06-20 LAB — COMPREHENSIVE METABOLIC PANEL
ALBUMIN: 4.1 g/dL (ref 3.5–5.0)
ALK PHOS: 63 U/L (ref 38–126)
ALT: 84 U/L — AB (ref 17–63)
ANION GAP: 10 (ref 5–15)
AST: 111 U/L — AB (ref 15–41)
BILIRUBIN TOTAL: 1.5 mg/dL — AB (ref 0.3–1.2)
CALCIUM: 9.2 mg/dL (ref 8.9–10.3)
CO2: 27 mmol/L (ref 22–32)
CREATININE: 0.65 mg/dL (ref 0.61–1.24)
Chloride: 101 mmol/L (ref 101–111)
GFR calc Af Amer: >60 mL/min (ref >60)
GFR calc non Af Amer: >60 mL/min (ref >60)
GLUCOSE: 104 mg/dL — AB (ref 65–99)
Potassium: 3.4 mmol/L — ABNORMAL LOW (ref 3.5–5.1)
SODIUM: 138 mmol/L (ref 135–145)
TOTAL PROTEIN: 7.2 g/dL (ref 6.5–8.1)

## 2017-06-20 LAB — CBC
HEMATOCRIT: 39 % (ref 39.0–52.0)
HEMOGLOBIN: 13.2 g/dL (ref 13.0–17.0)
MCH: 31.9 pg (ref 26.0–34.0)
MCHC: 33.8 g/dL (ref 30.0–36.0)
MCV: 94.2 fL (ref 78.0–100.0)
Platelets: 145 10*3/uL — ABNORMAL LOW (ref 150–400)
RBC: 4.14 MIL/uL — AB (ref 4.22–5.81)
RDW: 12.7 % (ref 11.5–15.5)
WBC: 5.1 10*3/uL (ref 4.0–10.5)

## 2017-06-20 MED ORDER — IBUPROFEN 800 MG PO TABS
800.0000 mg | ORAL_TABLET | Freq: Four times a day (QID) | ORAL | Status: DC | PRN
  Administered 2017-06-20: 800 mg via ORAL
  Filled 2017-06-20: qty 1

## 2017-06-20 MED ORDER — AMOXICILLIN-POT CLAVULANATE 875-125 MG PO TABS: 1.0000 | ORAL_TABLET | Freq: Two times a day (BID) | ORAL | 0 refills | Status: AC

## 2017-06-20 MED ORDER — CHLORDIAZEPOXIDE HCL 25 MG PO CAPS: 25.0000 mg | ORAL_CAPSULE | Freq: Three times a day (TID) | ORAL | 0 refills | Status: DC | PRN

## 2017-06-20 MED ORDER — POTASSIUM CHLORIDE CRYS ER 20 MEQ PO TBCR
40.0000 meq | EXTENDED_RELEASE_TABLET | Freq: Once | ORAL | Status: AC
Start: 2017-06-20 — End: 2017-06-20
  Administered 2017-06-20: 40 meq via ORAL
  Filled 2017-06-20: qty 2

## 2017-06-20 MED FILL — AMOX-CLAV 875-125 MG TABLET: 875-125 | 5 days supply | Qty: 10 | Fill #0

## 2017-06-20 NOTE — Progress Notes (Signed)
Report received from L. Hudson, RN. No change from initial pm assessment. Will continue to monitor and follow the POC. 

## 2017-06-20 NOTE — Progress Notes (Signed)
Went over discharge paperwork with patient and family.  All questions answered. Discharge paperwork and prescriptions given.  Explained importance of taking medication as prescribed and to refrain from using alcohol.  Support information given to family.

## 2017-06-20 NOTE — Discharge Instructions (Signed)

## 2017-06-20 NOTE — Clinical Social Work Note (Signed)
Clinical Social Work Assessment  Patient Details  Name: Douglas Browning MRN: [id number] Date of Birth: 1970/11/30  Date of referral:  06/20/17               Reason for consult:  Substance Use/ETOH Abuse                Permission sought to share information with:  Family Supports Permission granted to share information::  Yes, Verbal Permission Granted  Name::     Wife International aid/development worker::     Relationship::     Contact Information:     Housing/Transportation Living arrangements for the past 2 months:  Single Family Home Source of Information:  Patient, Spouse, Medical Team Patient Interpreter Needed:  None Criminal Activity/Legal Involvement Pertinent to Current Situation/Hospitalization:  No - Comment as needed Significant Relationships:  Other Family Members, Dependent Children, Spouse, Friend Lives with:    Do you feel safe going back to the place where you live?  Yes Need for family participation in patient care:  No (Coment)  Care giving concerns:  No caregiving concerns reported   Facilities manager / plan:  CSW consulted to assist with substance use treatment resources. Met with pt at bedside- wife present as well and pt agreed to have her participate.  Pt reports daily alcohol use for past 2 years. States he has had 1-2 week periods of sobriety "but when things get stressful I pick it up again." currently drinks 1-2 pints liquor and 40oz beer per day. Is interested in quitting due to "the toll on my family and my health." Has not attempted treatment in the past.  Pt currently is uninsured- CSW provided him with SA/etoh treatment resources with [employer] funding for [employer]. Educated pt on oupatient, intensive outpatient, and residential programs in area.  Pt plans to go to walk-in clinic at [facility] in upcoming days (wife agrees to assist him if they decide to schedule intake appointment instead).   Plan: DC home with wife/family, follow up with OP SA treatment.  States he will consider residential programs if that level of care is recommended upon his assessment at [facility].  Employment status:  Unemployed Forensic scientist:  Self Pay (Medicaid Pending) PT Recommendations:  Not assessed at this time Information / Referral to community resources:  Outpatient Substance Abuse Treatment Options, Residential Substance Abuse Treatment Options  Patient/Family's Response to care:  Appropriate- appreciative of care  Patient/Family's Understanding of and Emotional Response to Diagnosis, Current Treatment, and Prognosis:  Pt demonstrates adequate understanding of treatment and plan. Initially was somewhat guarded re: his use but with wife's encouragement became forthcoming and engaged. Emotionally appriopriate and seems motivated for treatment as evidenced by asking CSW questions about various agencies/programs and asking if he could call back with any questions he/wife think of later.   Emotional Assessment Appearance:  Appears older than stated age Attitude/Demeanor/Rapport:   (pleasant, appropriate) Affect (typically observed):  Accepting, Adaptable, Calm Orientation:  Oriented to Self, Oriented to Place, Oriented to  Time, Oriented to Situation Alcohol / Substance use:  Alcohol Use Psych involvement (Current and /or in the community):  No (Comment)  Discharge Needs  Concerns to be addressed:  Substance Abuse Concerns Readmission within the last 30 days:  No Current discharge risk:  None Barriers to Discharge:  No Barriers Identified   Nila Nephew, LCSW 06/20/2017, 12:32 PM [phone]

## 2017-06-20 NOTE — Discharge Summary (Signed)
Physician Discharge Summary  Douglas Browning ZOX:[id number] DOB: Feb 10, 1971 DOA: 06/19/2017  PCP: Lizbeth Bark, FNP  Admit date: 06/19/2017 Discharge date: 06/20/2017  Admitted From: Home Disposition:  Home  Recommendations for Outpatient Follow-up:  1. Follow up with PCP in 1 week 2. Stop alcohol use altogether, indefinitely. SW provided community resources. Librium prescribed for alcohol withdrawal.  3. Please obtain CMP in 1 week to check potassium as well as LFT and bilirubin 4. Will stop Toprol. Patient is on bother Toprol and Labetalol as outpatient. Would reconsider this. Patient's BP well controlled during hospitalization on labetalol, norvasc, HCTZ.  5. Follow up final blood culture results   Discharge Condition: Stable CODE STATUS: Full  Diet recommendation: Heart healthy   Brief/Interim Summary: HPI by Dr. Rito Ehrlich: Douglas Browning is a 46 y.o. male with medical history significant for heavy alcohol abuse who came to the emergency room on 9/12 due to left-sided chest pain and swelling as well as rigors. In the emergency room, patient was afebrile and had a normal white count. He was tremulous and there was a question of whether not this was attributable to alcohol. Patient's last drink was the night prior. Patient drinks about 2 pints of Browning liquor on most days and when he is not drinking Browning liquor, he drinks beer. Patient's lactic acid level was noted to be 4.5. Urine and chest x-ray unrevealing.  Given complaints of chest wall pain, swelling and tenderness, emergency room did a CT scan of chest and patient was found to have cellulitis of the left breast. Patient was given IV antibiotics and hospitalist will call for further evaluation and admission.   Patient's left breast cellulitis continued to improve. Was switched to oral antibiotic. He was afebrile with normal white count. He was advised to stop alcohol abuse, social work consulted and provided community resources for  cessation counseling. Patient as well as wife were agreeable.   Discharge Diagnoses:  Active Problems:   Essential hypertension   Alcohol abuse   Cellulitis of breast of male   Acute alcohol intoxication (HCC)   Lactic acidosis  Left breast cellulitis Blood culture pending  Improved in appearance, finish Augmentin as outpatient  Lactic acidosis Resolved with IV fluids  Alcohol abuse with acute intoxication on admission  Cessation counseling, social work consulted during hospitalization  Tobacco abuse Cessation counseling  HTN Continue norvasc, HCTZ, labetalol. Will stop toprol as patient already on beta blocking agent. BP stable this morning   HLD Continue lipitor   Depression Continue prozac  Hypokalemia Replaced  Discharge Instructions  Discharge Instructions    Call MD for:  difficulty breathing, headache or visual disturbances    Complete by:  As directed    Call MD for:  extreme fatigue    Complete by:  As directed    Call MD for:  hives    Complete by:  As directed    Call MD for:  persistant dizziness or light-headedness    Complete by:  As directed    Call MD for:  persistant nausea and vomiting    Complete by:  As directed    Call MD for:  severe uncontrolled pain    Complete by:  As directed    Call MD for:  temperature >100.4    Complete by:  As directed    Diet - low sodium heart healthy    Complete by:  As directed    Discharge instructions    Complete by:  As directed  You were cared for by a hospitalist during your hospital stay. If you have any questions about your discharge medications or the care you received while you were in the hospital after you are discharged, you can call the unit and asked to speak with the hospitalist on call if the hospitalist that took care of you is not available. Once you are discharged, your primary care physician will handle any further medical issues. Please note that NO REFILLS for any discharge medications  will be authorized once you are discharged, as it is imperative that you return to your primary care physician (or establish a relationship with a primary care physician if you do not have one) for your aftercare needs so that they can reassess your need for medications and monitor your lab values.   Discharge instructions    Complete by:  As directed    You must stop drinking alcohol   Increase activity slowly    Complete by:  As directed      Allergies as of 06/20/2017      Reactions   Lisinopril Swelling   angioedema      Medication List    STOP taking these medications   metoprolol succinate 25 MG 24 hr tablet Commonly known as:  TOPROL XL     TAKE these medications   amLODipine 10 MG tablet Commonly known as:  NORVASC Take 1 tablet (10 mg total) by mouth daily.   amoxicillin-clavulanate 875-125 MG tablet Commonly known as:  AUGMENTIN Take 1 tablet by mouth every 12 (twelve) hours.   aspirin EC 81 MG tablet Take 1 tablet (81 mg total) by mouth daily.   atorvastatin 10 MG tablet Commonly known as:  LIPITOR Take 1 tablet (10 mg total) by mouth daily.   chlordiazePOXIDE 25 MG capsule Commonly known as:  LIBRIUM Take 1 capsule (25 mg total) by mouth 3 (three) times daily as needed for withdrawal (while going through alcohol withdrawal. do not take if drinking alcohol.).   FLUoxetine 20 MG capsule Commonly known as:  PROZAC Take 1 capsule (20 mg total) by mouth daily.   folic acid 1 MG tablet Commonly known as:  FOLVITE Take 1 tablet (1 mg total) by mouth daily.   hydrochlorothiazide 25 MG tablet Commonly known as:  HYDRODIURIL Take 1 tablet (25 mg total) by mouth daily.   ibuprofen 200 MG tablet Commonly known as:  ADVIL,MOTRIN Take 400 mg by mouth every 6 (six) hours as needed for mild pain.   labetalol 300 MG tablet Commonly known as:  NORMODYNE Take 1 tablet (300 mg total) by mouth 2 (two) times daily.   thiamine 100 MG tablet Take 1 tablet (100 mg  total) by mouth daily.            Discharge Care Instructions        Start     Ordered   06/20/17 0000  amoxicillin-clavulanate (AUGMENTIN) 875-125 MG tablet  Every 12 hours     06/20/17 1024   06/20/17 0000  Increase activity slowly     06/20/17 1025   06/20/17 0000  Diet - low sodium heart healthy     06/20/17 1025   06/20/17 0000  Discharge instructions    Comments:  You were cared for by a hospitalist during your hospital stay. If you have any questions about your discharge medications or the care you received while you were in the hospital after you are discharged, you can call the unit and asked to speak  with the hospitalist on call if the hospitalist that took care of you is not available. Once you are discharged, your primary care physician will handle any further medical issues. Please note that NO REFILLS for any discharge medications will be authorized once you are discharged, as it is imperative that you return to your primary care physician (or establish a relationship with a primary care physician if you do not have one) for your aftercare needs so that they can reassess your need for medications and monitor your lab values.   06/20/17 1025   06/20/17 0000  Call MD for:  temperature >100.4     06/20/17 1025   06/20/17 0000  Call MD for:  persistant nausea and vomiting     06/20/17 1025   06/20/17 0000  Call MD for:  severe uncontrolled pain     06/20/17 1025   06/20/17 0000  Call MD for:  extreme fatigue     06/20/17 1025   06/20/17 0000  Call MD for:  persistant dizziness or light-headedness     06/20/17 1025   06/20/17 0000  Call MD for:  difficulty breathing, headache or visual disturbances     06/20/17 1025   06/20/17 0000  Call MD for:  hives     06/20/17 1025   06/20/17 0000  Discharge instructions    Comments:  You must stop drinking alcohol   06/20/17 1025   06/20/17 0000  chlordiazePOXIDE (LIBRIUM) 25 MG capsule  3 times daily PRN     06/20/17 1027      Follow-up Information    Lizbeth Bark, FNP. Schedule an appointment as soon as possible for a visit in 1 week(s).   Specialty:  Family Medicine Contact information: [address] [place] [address] [phone]          Allergies  Allergen Reactions  . Lisinopril Swelling    angioedema    Consultations:  None   Procedures/Studies: Dg Chest 2 View  Result Date: 06/19/2017 CLINICAL DATA:  Left anterior chest pain and swelling. No known trauma. EXAM: CHEST  2 VIEW COMPARISON:  January 25, 2016. FINDINGS: The cardiomediastinal silhouette is normal in size. Normal pulmonary vascularity. No focal consolidation, pleural effusion, or pneumothorax. No acute osseous abnormality. IMPRESSION: No active cardiopulmonary disease. Electronically Signed   By: Obie Dredge M.D.   On: 06/19/2017 08:56   Ct Chest W Contrast  Result Date: 06/19/2017 CLINICAL DATA:  Shortness of breath. Intoxication. Left chest tenderness to touch. EXAM: CT CHEST WITH CONTRAST TECHNIQUE: Multidetector CT imaging of the chest was performed during intravenous contrast administration. CONTRAST:  75mL ISOVUE-300 IOPAMIDOL (ISOVUE-300) INJECTION 61% COMPARISON:  None. FINDINGS: Cardiovascular: Normal heart size and mediastinal contours. No acute vascular finding. Mediastinum/Nodes: Negative for adenopathy or mass. Lungs/Pleura: Some mucus is seen within the central airways. There is no edema, consolidation, effusion, or pneumothorax. Calcified granuloma in the right upper lobe. Upper Abdomen: Prominent hepatic steatosis. Exophytic simple appearing cyst from the upper pole left kidney measuring 34 mm. Musculoskeletal: There is skin thickening and subcutaneous stranding along the left anterior chest wall. Negative for abscess. No acute or aggressive osseous finding. IMPRESSION: 1. Cellulitic changes at the left anterior chest wall. Negative for abscess. 2. No acute intrathoracic finding. 3. Hepatic steatosis.  Electronically Signed   By: Marnee Spring M.D.   On: 06/19/2017 11:03      Discharge Exam: Vitals:   06/20/17 0535 06/20/17 0904  BP: 112/65 (!) 143/94  Pulse: 92  88  Resp: 20   Temp: 98.8 F (37.1 C)   SpO2: 100%    Vitals:   06/19/17 1630 06/19/17 2049 06/20/17 0535 06/20/17 0904  BP: (!) 150/88 115/77 112/65 (!) 143/94  Pulse:  91 92 88  Resp:  20 20   Temp:  99.2 F (37.3 C) 98.8 F (37.1 C)   TempSrc:  Oral Oral   SpO2:  100% 100%   Weight:      Height:        General: Pt is alert, awake, not in acute distress Cardiovascular: RRR, S1/S2 +, no rubs, no gallops Respiratory: CTA bilaterally, no wheezing, no rhonchi Abdominal: Soft, NT, ND, bowel sounds + Extremities: no edema, no cyanosis Skin: left breast with minimal swelling, no erythema, no pus or drainage     The results of significant diagnostics from this hospitalization (including imaging, microbiology, ancillary and laboratory) are listed below for reference.     Microbiology: Recent Results (from the past 240 hour(s))  Culture, blood (routine x 2)     Status: None (Preliminary result)   Collection Time: 06/19/17 11:43 AM  Result Value Ref Range Status   Specimen Description BLOOD RIGHT ANTECUBITAL  Final   Special Requests   Final    BOTTLES DRAWN AEROBIC AND ANAEROBIC Blood Culture adequate volume   Culture   Final    NO GROWTH < 24 HOURS Performed at [facility], [address] [place], [place] [address]    Report Status PENDING  Incomplete  Culture, blood (routine x 2)     Status: None (Preliminary result)   Collection Time: 06/19/17 12:00 PM  Result Value Ref Range Status   Specimen Description BLOOD LEFT ANTECUBITAL  Final   Special Requests   Final    BOTTLES DRAWN AEROBIC AND ANAEROBIC Blood Culture adequate volume   Culture   Final    NO GROWTH < 24 HOURS Performed at [facility], [address] [place], [place] [address]    Report Status PENDING  Incomplete      Labs: BNP (last 3 results) No results for input(s): BNP in the last 8760 hours. Basic Metabolic Panel:  Recent Labs Lab 06/19/17 0855 06/20/17 0836  NA 138 138  K 3.6 3.4*  CL 95* 101  CO2 24 27  GLUCOSE 101* 104*  BUN 8 <5*  CREATININE 0.70 0.65  CALCIUM 9.6 9.2   Liver Function Tests:  Recent Labs Lab 06/19/17 0855 06/20/17 0836  AST 153* 111*  ALT 105* 84*  ALKPHOS 67 63  BILITOT 1.0 1.5*  PROT 8.7* 7.2  ALBUMIN 4.9 4.1   No results for input(s): LIPASE, AMYLASE in the last 168 hours. No results for input(s): AMMONIA in the last 168 hours. CBC:  Recent Labs Lab 06/19/17 0855 06/20/17 0836  WBC 5.6 5.1  NEUTROABS 2.9  --   HGB 15.5 13.2  HCT 44.5 39.0  MCV 93.3 94.2  PLT 174 145*   Cardiac Enzymes: No results for input(s): CKTOTAL, CKMB, CKMBINDEX, TROPONINI in the last 168 hours. BNP: Invalid input(s): POCBNP CBG: No results for input(s): GLUCAP in the last 168 hours. D-Dimer No results for input(s): DDIMER in the last 72 hours. Hgb A1c No results for input(s): HGBA1C in the last 72 hours. Lipid Profile No results for input(s): CHOL, HDL, LDLCALC, TRIG, CHOLHDL, LDLDIRECT in the last 72 hours. Thyroid function studies No results for input(s): TSH, T4TOTAL, T3FREE, THYROIDAB in the last 72 hours.  Invalid input(s):  FREET3 Anemia work up No results for input(s): VITAMINB12, FOLATE, FERRITIN, TIBC, IRON, RETICCTPCT in the last 72 hours. Urinalysis    Component Value Date/Time   COLORURINE YELLOW 06/19/2017 1018   APPEARANCEUR CLEAR 06/19/2017 1018   LABSPEC 1.020 06/19/2017 1018   PHURINE 6.0 06/19/2017 1018   GLUCOSEU NEGATIVE 06/19/2017 1018   HGBUR SMALL (A) 06/19/2017 1018   BILIRUBINUR NEGATIVE 06/19/2017 1018   KETONESUR 80 (A) 06/19/2017 1018   PROTEINUR 100 (A) 06/19/2017 1018   NITRITE NEGATIVE 06/19/2017 1018   LEUKOCYTESUR NEGATIVE 06/19/2017 1018   Sepsis Labs Invalid input(s): PROCALCITONIN,  WBC,   LACTICIDVEN Microbiology Recent Results (from the past 240 hour(s))  Culture, blood (routine x 2)     Status: None (Preliminary result)   Collection Time: 06/19/17 11:43 AM  Result Value Ref Range Status   Specimen Description BLOOD RIGHT ANTECUBITAL  Final   Special Requests   Final    BOTTLES DRAWN AEROBIC AND ANAEROBIC Blood Culture adequate volume   Culture   Final    NO GROWTH < 24 HOURS Performed at [facility], [address], [place], [place] [address]    Report Status PENDING  Incomplete  Culture, blood (routine x 2)     Status: None (Preliminary result)   Collection Time: 06/19/17 12:00 PM  Result Value Ref Range Status   Specimen Description BLOOD LEFT ANTECUBITAL  Final   Special Requests   Final    BOTTLES DRAWN AEROBIC AND ANAEROBIC Blood Culture adequate volume   Culture   Final    NO GROWTH < 24 HOURS Performed at [facility], [address], [place], [place] [address]    Report Status PENDING  Incomplete     Time coordinating discharge: 40 minutes  SIGNED:  Noralee Stain, DO [facility] Pager [phone]  If 7PM-7AM, please contact night-coverage [email or web address] Password [email or web address] 06/20/2017, 1:16 PM

## 2017-06-24 ENCOUNTER — Other Ambulatory Visit: Payer: Self-pay | Admitting: Family Medicine

## 2017-06-24 ENCOUNTER — Ambulatory Visit: Payer: Self-pay | Attending: Family Medicine | Admitting: Family Medicine

## 2017-06-24 ENCOUNTER — Ambulatory Visit: Payer: Self-pay | Admitting: Licensed Clinical Social Worker

## 2017-06-24 VITALS — BP 144/83 | HR 100 | Wt 187.4 lb

## 2017-06-24 DIAGNOSIS — I10 Essential (primary) hypertension: Secondary | ICD-10-CM | POA: Insufficient documentation

## 2017-06-24 DIAGNOSIS — F32A Depression, unspecified: Secondary | ICD-10-CM

## 2017-06-24 DIAGNOSIS — F10930 Alcohol use, unspecified with withdrawal, uncomplicated: Secondary | ICD-10-CM

## 2017-06-24 DIAGNOSIS — F1019 Alcohol abuse with unspecified alcohol-induced disorder: Secondary | ICD-10-CM

## 2017-06-24 DIAGNOSIS — Z79899 Other long term (current) drug therapy: Secondary | ICD-10-CM | POA: Insufficient documentation

## 2017-06-24 DIAGNOSIS — F329 Major depressive disorder, single episode, unspecified: Secondary | ICD-10-CM

## 2017-06-24 DIAGNOSIS — F419 Anxiety disorder, unspecified: Secondary | ICD-10-CM

## 2017-06-24 DIAGNOSIS — Z7982 Long term (current) use of aspirin: Secondary | ICD-10-CM | POA: Insufficient documentation

## 2017-06-24 DIAGNOSIS — F1010 Alcohol abuse, uncomplicated: Secondary | ICD-10-CM

## 2017-06-24 DIAGNOSIS — F10230 Alcohol dependence with withdrawal, uncomplicated: Secondary | ICD-10-CM | POA: Insufficient documentation

## 2017-06-24 LAB — CULTURE, BLOOD (ROUTINE X 2)
CULTURE: NO GROWTH
Culture: NO GROWTH
Special Requests: ADEQUATE
Special Requests: ADEQUATE

## 2017-06-24 MED ORDER — CHLORDIAZEPOXIDE HCL 25 MG PO CAPS: 25.0000 mg | ORAL_CAPSULE | Freq: Three times a day (TID) | ORAL | 1 refills | Status: AC | PRN

## 2017-06-24 MED ORDER — CLONIDINE HCL 0.1 MG PO TABS
0.1000 mg | ORAL_TABLET | Freq: Once | ORAL | Status: AC
Start: 2017-06-24 — End: 2017-06-24
  Administered 2017-06-24: 0.1 mg via ORAL

## 2017-06-24 NOTE — Progress Notes (Signed)
Pt arrived to [facility]. He is alert and oriented. Last OV 06/05/2017  with PCP. Pt hands are shaky. He is very sweaty.   Pt denies chest pain, SOB, HA, dizziness, or blurred vision.  Medication verified with patient. He states he has not taken  Medication amlodipine or atorvastatin, nor has he picked up Librium.  Manual blood pressure reading: 170/100  HR 100-102   PLAN-  Pt to see PCP today. Administered Clonidine 0.1 mg

## 2017-06-24 NOTE — Progress Notes (Unsigned)
   Subjective:  Patient ID: OMAIR DETTMER, male    DOB: Jan 27, 1971  Age: 46 y.o. MRN: [id number]  CC: No chief complaint on file.   HPI HARL WIECHMANN presents for 0.2  Outpatient Medications Prior to Visit  Medication Sig Dispense Refill  . amLODipine (NORVASC) 10 MG tablet Take 1 tablet (10 mg total) by mouth daily. 30 tablet 2  . amoxicillin-clavulanate (AUGMENTIN) 875-125 MG tablet Take 1 tablet by mouth every 12 (twelve) hours. (Patient not taking: Reported on 06/24/2017) 10 tablet 0  . aspirin EC 81 MG tablet Take 1 tablet (81 mg total) by mouth daily. 90 tablet 2  . atorvastatin (LIPITOR) 10 MG tablet Take 1 tablet (10 mg total) by mouth daily. (Patient not taking: Reported on 06/24/2017) 30 tablet 2  . chlordiazePOXIDE (LIBRIUM) 25 MG capsule Take 1 capsule (25 mg total) by mouth 3 (three) times daily as needed for withdrawal (while going through alcohol withdrawal. do not take if drinking alcohol.). (Patient not taking: Reported on 06/24/2017) 9 capsule 0  . FLUoxetine (PROZAC) 20 MG capsule Take 1 capsule (20 mg total) by mouth daily. 60 capsule 0  . folic acid (FOLVITE) 1 MG tablet Take 1 tablet (1 mg total) by mouth daily. 30 tablet 6  . hydrochlorothiazide (HYDRODIURIL) 25 MG tablet Take 1 tablet (25 mg total) by mouth daily. 30 tablet 2  . ibuprofen (ADVIL,MOTRIN) 200 MG tablet Take 400 mg by mouth every 6 (six) hours as needed for mild pain.    Marland Kitchen labetalol (NORMODYNE) 300 MG tablet Take 1 tablet (300 mg total) by mouth 2 (two) times daily. 60 tablet 3  . thiamine 100 MG tablet Take 1 tablet (100 mg total) by mouth daily. (Patient not taking: Reported on 06/24/2017) 30 tablet 2   No facility-administered medications prior to visit.     ROS Review of Systems    Objective:  There were no vitals taken for this visit.  BP/Weight 06/24/2017 06/20/2017 06/19/2017  Systolic BP 170 143 -  Diastolic BP 100 94 -  Wt. (Lbs) - - 198  BMI - - 26.12     Physical  Exam   Assessment & Plan:   Problem List Items Addressed This Visit    None      No orders of the defined types were placed in this encounter.   Follow-up: No Follow-up on file.   Keron Koffman R Marlaine Arey

## 2017-06-24 NOTE — Patient Instructions (Addendum)
Return tomorrow for lab work.   Alcohol Withdrawal When a person who drinks a lot of alcohol stops drinking, he or she may go through alcohol withdrawal. Alcohol withdrawal causes problems. It can make you feel:  Tired (fatigued).  Sad (depressed).  Fearful (anxious).  Grouchy (irritable).  Not hungry.  Sick to your stomach (nauseous).  Shaky.  It can also make you have:  Nightmares.  Trouble sleeping.  Trouble thinking clearly.  Mood swings.  Clammy skin.  Very bad sweating.  A very fast heartbeat.  Shaking that you cannot control (tremor).  Having a fever.  A fit of movements that you cannot control (seizure).  Confusion.  Throwing up (vomiting).  Feeling or seeing things that are not there (hallucinations).  Follow these instructions at home:  Take medicines and vitamins only as told by your doctor.  Do not drink alcohol.  Have someone around in case you need help.  Drink enough fluid to keep your pee (urine) clear or pale yellow.  Think about joining a group to help you stop drinking. Contact a doctor if:  Your problems get worse.  Your problems do not go away.  You cannot eat or drink without throwing up.  You are having a hard time not drinking alcohol.  You cannot stop drinking alcohol. Get help right away if:  You feel your heart beating differently than usual.  Your chest hurts.  You have trouble breathing.  You have very bad problems, like: ? A fever. ? A fit of movements that you cannot control. ? Being very confused. ? Feeling or seeing things that are not there. This information is not intended to replace advice given to you by your health care provider. Make sure you discuss any questions you have with your health care provider. Document Released: 03/12/2008 Document Revised: 03/01/2016 Document Reviewed: 07/13/2014 Elsevier Interactive Patient Education  2018 [employer].

## 2017-06-24 NOTE — Progress Notes (Deleted)
   Subjective:  Patient ID: Douglas Browning, male    DOB: [birth year]  Age: 46 y.o. MRN: [id number]  CC: No chief complaint on file.   HPI [name] presents for        Outpatient Medications Prior to Visit  Medication Sig Dispense Refill  . amLODipine (NORVASC) 10 MG tablet Take 1 tablet (10 mg total) by mouth daily. 30 tablet 2  . aspirin EC 81 MG tablet Take 1 tablet (81 mg total) by mouth daily. 90 tablet 2  . FLUoxetine (PROZAC) 20 MG capsule Take 1 capsule (20 mg total) by mouth daily. 60 capsule 0  . folic acid (FOLVITE) 1 MG tablet Take 1 tablet (1 mg total) by mouth daily. 30 tablet 6  . hydrochlorothiazide (HYDRODIURIL) 25 MG tablet Take 1 tablet (25 mg total) by mouth daily. 30 tablet 2  . labetalol (NORMODYNE) 300 MG tablet Take 1 tablet (300 mg total) by mouth 2 (two) times daily. 60 tablet 3  . amoxicillin-clavulanate (AUGMENTIN) 875-125 MG tablet Take 1 tablet by mouth every 12 (twelve) hours. (Patient not taking: Reported on 06/24/2017) 10 tablet 0  . atorvastatin (LIPITOR) 10 MG tablet Take 1 tablet (10 mg total) by mouth daily. (Patient not taking: Reported on 06/24/2017) 30 tablet 2  . chlordiazePOXIDE (LIBRIUM) 25 MG capsule Take 1 capsule (25 mg total) by mouth 3 (three) times daily as needed for withdrawal (while going through alcohol withdrawal. do not take if drinking alcohol.). (Patient not taking: Reported on 06/24/2017) 9 capsule 0  . ibuprofen (ADVIL,MOTRIN) 200 MG tablet Take 400 mg by mouth every 6 (six) hours as needed for mild pain.    Marland Kitchen thiamine 100 MG tablet Take 1 tablet (100 mg total) by mouth daily. (Patient not taking: Reported on 06/24/2017) 30 tablet 2   No facility-administered medications prior to visit.     ROS Review of Systems  Review of Systems - {ros [id number]}    Objective:  BP (!) 170/100   Pulse 100   Wt 187 lb 6.4 oz (85 kg)   SpO2 99%   BMI 24.72 kg/m   BP/Weight 06/24/2017 06/20/2017 06/19/2017  Systolic BP 170 143 -    Diastolic BP 100 94 -  Wt. (Lbs) 187.4 - 198  BMI 24.72 - 26.12     Physical Exam   Assessment & Plan:   Problem List Items Addressed This Visit      Cardiovascular and Mediastinum   Essential hypertension - Primary   Relevant Medications   cloNIDine (CATAPRES) tablet 0.1 mg (Completed)      Meds ordered this encounter  Medications  . cloNIDine (CATAPRES) tablet 0.1 mg    Follow-up: No Follow-up on file.   Lizbeth Bark FNP

## 2017-06-25 ENCOUNTER — Ambulatory Visit: Payer: Self-pay | Attending: Family Medicine

## 2017-06-25 DIAGNOSIS — F10230 Alcohol dependence with withdrawal, uncomplicated: Secondary | ICD-10-CM | POA: Insufficient documentation

## 2017-06-25 DIAGNOSIS — F10930 Alcohol use, unspecified with withdrawal, uncomplicated: Secondary | ICD-10-CM

## 2017-06-25 DIAGNOSIS — F1019 Alcohol abuse with unspecified alcohol-induced disorder: Secondary | ICD-10-CM | POA: Insufficient documentation

## 2017-06-25 NOTE — Progress Notes (Signed)
Patient here for lab visit only 

## 2017-06-25 NOTE — BH Specialist Note (Signed)
Integrated Behavioral Health Initial Visit  MRN: [id number] Name: Douglas Browning  Number of Integrated Behavioral Health Clinician visits:: 1/6 Session Start time: 4:30 PM  Session End time: 5:00 PM Total time: 30 minutes  Type of Service: Integrated Behavioral Health- Individual/Family Interpretor:No. Interpretor Name and Language: N/A   Warm Hand Off Completed.       SUBJECTIVE: Douglas Browning is a 46 y.o. male accompanied by self Patient was referred by FNP [place] for depression and anxiety. Patient reports the following symptoms/concerns: overwhelming feelings of sadness and worry, difficulty sleeping, low motivation, difficulty concentrating, irritability Duration of problem: Ongoing; Severity of problem: severe  OBJECTIVE: Mood: Depressed and Affect: Appropriate Risk of harm to self or others: No plan to harm self or others  LIFE CONTEXT: Family and Social: Pt is separated from spouse. He has two children in [place] School/Work: Pt is unemployed.  Self-Care: Pt drinks alcohol (pint of vodka) 2-3 x weekly. He participates in medication management through PCP Life Changes: Pt is experiencing financial strain and engages in alcohol use  GOALS ADDRESSED: Patient will: 1. Reduce symptoms of: depression 2. Increase knowledge and/or ability of: coping skills  3. Demonstrate ability to: Increase adequate support systems for patient/family and Decrease self-medicating behaviors  INTERVENTIONS: Interventions utilized: Motivational Interviewing, Supportive Counseling, Psychoeducation and/or Health Education and Link to [employer]  Standardized Assessments completed: Not Needed  ASSESSMENT: Patient currently experiencing depression triggered by financial strain, limited support, and substance use.   Patient may benefit from psychoeducation, psychotherapy, and medication management. LCSWA educated pt on how stress and substance use can negatively impact one's mental  and physical health. Pt denied SI/HI/AVH. He participates in medication management through PCP and is open to initiating psychotherapy. LCSWA provided pt with crisis resources, psychotherapy, and substance use treatment (ADS).   PLAN: 1. Follow up with behavioral health clinician on : Pt was encouraged to contact LCSWA if symptoms worsen or fail to improve to schedule behavioral appointments at [facility]. 2. Behavioral recommendations: LCSWA recommends that pt comply with medication management, initiate substance use treatment, and utilize resources. Pt is encouraged to schedule follow up appointment with LCSWA 3. Referral(s): Substance Abuse Program and Community Resources:  Academic librarian 4. "From scale of 1-10, how likely are you to follow plan?": 7/10  Bridgett Larsson, LCSW 06/25/17 3:59 PM

## 2017-06-26 ENCOUNTER — Other Ambulatory Visit: Payer: Self-pay | Admitting: Family Medicine

## 2017-06-26 ENCOUNTER — Telehealth: Payer: Self-pay

## 2017-06-26 DIAGNOSIS — E876 Hypokalemia: Secondary | ICD-10-CM

## 2017-06-26 LAB — CBC
Hematocrit: 40.2 % (ref 37.5–51.0)
Hemoglobin: 13.8 g/dL (ref 13.0–17.7)
MCH: 32.2 pg (ref 26.6–33.0)
MCHC: 34.3 g/dL (ref 31.5–35.7)
MCV: 94 fL (ref 79–97)
PLATELETS: 172 10*3/uL (ref 150–379)
RBC: 4.28 x10E6/uL (ref 4.14–5.80)
RDW: 13.8 % (ref 12.3–15.4)
WBC: 7.8 10*3/uL (ref 3.4–10.8)

## 2017-06-26 LAB — CMP AND LIVER
ALBUMIN: 4.8 g/dL (ref 3.5–5.5)
ALT: 90 IU/L — ABNORMAL HIGH (ref 0–44)
AST: 59 IU/L — AB (ref 0–40)
Alkaline Phosphatase: 72 IU/L (ref 39–117)
BILIRUBIN TOTAL: 0.7 mg/dL (ref 0.0–1.2)
BUN: 10 mg/dL (ref 6–24)
Bilirubin, Direct: 0.23 mg/dL (ref 0.00–0.40)
CALCIUM: 10 mg/dL (ref 8.7–10.2)
CHLORIDE: 99 mmol/L (ref 96–106)
CO2: 23 mmol/L (ref 20–29)
CREATININE: 0.99 mg/dL (ref 0.76–1.27)
GFR calc non Af Amer: 90 mL/min/{1.73_m2} (ref >59)
GFR, EST AFRICAN AMERICAN: 104 mL/min/{1.73_m2} (ref >59)
GLUCOSE: 90 mg/dL (ref 65–99)
POTASSIUM: 3.4 mmol/L — AB (ref 3.5–5.2)
SODIUM: 141 mmol/L (ref 134–144)
TOTAL PROTEIN: 7.6 g/dL (ref 6.0–8.5)

## 2017-06-26 LAB — BILIRUBIN, FRACTIONATED(TOT/DIR/INDIR): Bilirubin, Indirect: 0.47 mg/dL (ref 0.10–0.80)

## 2017-06-26 MED ORDER — POTASSIUM CHLORIDE ER 20 MEQ PO TBCR: 20.0000 meq | EXTENDED_RELEASE_TABLET | Freq: Every day | ORAL | 0 refills | Status: AC

## 2017-06-26 NOTE — Telephone Encounter (Signed)
CMA call regarding lab results   Patient verify DOB  Patient was aware and understood  

## 2017-06-26 NOTE — Telephone Encounter (Signed)
-----   Message from Lizbeth Bark, FNP sent at 06/26/2017  1:06 PM EDT ----- Liver function is decreased, this can happen with alcohol use overtime. Start gradually decreasing alcohol intake, follow up with alcoholism resources, and avoid tylenol. Decrease your intake of fried foods and whole milk.  Potassium level is low. You will be prescribed a potassium supplement. Recommend rechecking levels in 4 weeks. Kidney function normal.  Labs that evaluated your blood cells.  No signs of anemia, acute infection, or inflammation present.

## 2017-07-02 NOTE — Progress Notes (Signed)
Subjective:  Patient ID: Douglas Browning, male    DOB: 09/27/1971  Age: 46 y.o. MRN: [id number]  CC: Hypertension and Alcohol Problem   HPI Douglas Browning presents for BP check for hypertension with clinic RN. BP found to be elevated during visit. Patient denies chest pain, chest pressure/discomfort and palpitations. Patient was also noted to have tremors. He reports d/c alcohol about 3 days prior before drinking 1 bottle of beer yesterday. He reports drinking beer due to tremors. History of alcoholism. He denies current suicidal and homicidal plan or intent. He denies any family history of alcoholism. Stressors include financial, and recent separation from his wife.  Previous treatment includes none. He is agreeable to speaking with LCSW and medication at this time.      Outpatient Medications Prior to Visit  Medication Sig Dispense Refill  . amLODipine (NORVASC) 10 MG tablet Take 1 tablet (10 mg total) by mouth daily. 30 tablet 2  . aspirin EC 81 MG tablet Take 1 tablet (81 mg total) by mouth daily. 90 tablet 2  . FLUoxetine (PROZAC) 20 MG capsule Take 1 capsule (20 mg total) by mouth daily. 60 capsule 0  . folic acid (FOLVITE) 1 MG tablet Take 1 tablet (1 mg total) by mouth daily. 30 tablet 6  . hydrochlorothiazide (HYDRODIURIL) 25 MG tablet Take 1 tablet (25 mg total) by mouth daily. 30 tablet 2  . labetalol (NORMODYNE) 300 MG tablet Take 1 tablet (300 mg total) by mouth 2 (two) times daily. 60 tablet 3  . amoxicillin-clavulanate (AUGMENTIN) 875-125 MG tablet Take 1 tablet by mouth every 12 (twelve) hours. (Patient not taking: Reported on 06/24/2017) 10 tablet 0  . atorvastatin (LIPITOR) 10 MG tablet Take 1 tablet (10 mg total) by mouth daily. (Patient not taking: Reported on 06/24/2017) 30 tablet 2  . ibuprofen (ADVIL,MOTRIN) 200 MG tablet Take 400 mg by mouth every 6 (six) hours as needed for mild pain.    Marland Kitchen thiamine 100 MG tablet Take 1 tablet (100 mg total) by mouth daily. (Patient not  taking: Reported on 06/24/2017) 30 tablet 2  . chlordiazePOXIDE (LIBRIUM) 25 MG capsule Take 1 capsule (25 mg total) by mouth 3 (three) times daily as needed for withdrawal (while going through alcohol withdrawal. do not take if drinking alcohol.). (Patient not taking: Reported on 06/24/2017) 9 capsule 0   No facility-administered medications prior to visit.     ROS Review of Systems  Constitutional: Negative.   Respiratory: Negative.   Cardiovascular: Negative.   Gastrointestinal: Negative.   Neurological: Positive for tremors.  Psychiatric/Behavioral: Positive for behavioral problems (alcoholism ). Negative for suicidal ideas.    Objective:  BP (!) 144/83   Pulse 100   Wt 187 lb 6.4 oz (85 kg)   SpO2 99%   BMI 24.72 kg/m   BP/Weight 06/24/2017 06/20/2017 06/19/2017  Systolic BP 144 143 -  Diastolic BP 83 94 -  Wt. (Lbs) 187.4 - 198  BMI 24.72 - 26.12    Physical Exam  Constitutional: He is oriented to person, place, and time. He appears well-developed and well-nourished. No distress.  Eyes: Pupils are equal, round, and reactive to light. Conjunctivae are normal.  Neck: No JVD present.  Cardiovascular: Normal rate, regular rhythm, normal heart sounds and intact distal pulses.   Pulmonary/Chest: Effort normal and breath sounds normal.  Abdominal: Soft. Bowel sounds are normal. There is no tenderness.  Neurological: He is alert and oriented to person, place, and time. He displays tremor (  with hands extended).  Skin: Skin is warm. He is diaphoretic.  Psychiatric: He is not actively hallucinating. He exhibits a depressed mood. He expresses no homicidal and no suicidal ideation. He expresses no suicidal plans and no homicidal plans.  Nursing note and vitals reviewed.   Assessment & Plan:   1. Essential hypertension  -Suspect abrupt discontinuation of alcohol and alcohol withdrawl could be contributing. -Clonidine given per protocol. -Follow up in 2 weeks. - cloNIDine  (CATAPRES) tablet 0.1 mg; Take 1 tablet (0.1 mg total) by mouth once.  2. Alcohol abuse with alcohol-induced disorder (HCC) LSCW spoke with patient and provided resources. - chlordiazePOXIDE (LIBRIUM) 25 MG capsule; Take 1 capsule (25 mg total) by mouth 3 (three) times daily as needed for withdrawal (while going through alcohol withdrawal. do not take if drinking alcohol.).  Dispense: 90 capsule; Refill: 1 - Ambulatory referral to Psychiatry - CMP and Liver - CBC - Bilirubin, fractionated(tot/dir/indir)  3. Alcohol withdrawal syndrome without complication (HCC) LSCW spoke with patient and provided resources. - chlordiazePOXIDE (LIBRIUM) 25 MG capsule; Take 1 capsule (25 mg total) by mouth 3 (three) times daily as needed for withdrawal (while going through alcohol withdrawal. do not take if drinking alcohol.).  Dispense: 90 capsule; Refill: 1 - Ambulatory referral to Psychiatry - CMP and Liver - CBC - Bilirubin, fractionated(tot/dir/indir)   Meds ordered this encounter  Medications  . cloNIDine (CATAPRES) tablet 0.1 mg  . chlordiazePOXIDE (LIBRIUM) 25 MG capsule    Sig: Take 1 capsule (25 mg total) by mouth 3 (three) times daily as needed for withdrawal (while going through alcohol withdrawal. do not take if drinking alcohol.).    Dispense:  90 capsule    Refill:  1    Order Specific Question:   Supervising Provider    Answer:   Quentin Angst [[id number]]    Follow-up: Return in about 2 weeks (around 07/08/2017) for HTN/ Alcoholism.    Lizbeth Bark FNP

## 2017-07-11 ENCOUNTER — Encounter: Payer: Self-pay | Admitting: Family Medicine

## 2017-07-11 ENCOUNTER — Ambulatory Visit: Payer: Self-pay | Attending: Family Medicine | Admitting: Family Medicine

## 2017-07-11 VITALS — BP 115/72 | HR 100 | Temp 98.1°F | Resp 18 | Ht 73.0 in | Wt 196.0 lb

## 2017-07-11 DIAGNOSIS — J3489 Other specified disorders of nose and nasal sinuses: Secondary | ICD-10-CM | POA: Insufficient documentation

## 2017-07-11 DIAGNOSIS — I10 Essential (primary) hypertension: Secondary | ICD-10-CM | POA: Insufficient documentation

## 2017-07-11 DIAGNOSIS — Z7982 Long term (current) use of aspirin: Secondary | ICD-10-CM | POA: Insufficient documentation

## 2017-07-11 DIAGNOSIS — Z23 Encounter for immunization: Secondary | ICD-10-CM | POA: Insufficient documentation

## 2017-07-11 DIAGNOSIS — F1010 Alcohol abuse, uncomplicated: Secondary | ICD-10-CM | POA: Insufficient documentation

## 2017-07-11 MED ORDER — FLUTICASONE PROPIONATE 50 MCG/ACT NA SUSP: 2.0000 | Freq: Every day | NASAL | 0 refills | Status: AC

## 2017-07-11 NOTE — Progress Notes (Signed)
Patient is here for f/up  

## 2017-07-11 NOTE — Patient Instructions (Addendum)
Alcohol Use Disorder Alcohol use disorder is when your drinking disrupts your daily life. When you have this condition, you drink too much alcohol and you cannot control your drinking. Alcohol use disorder can cause serious problems with your physical health. It can affect your brain, heart, liver, pancreas, immune system, stomach, and intestines. Alcohol use disorder can increase your risk for certain cancers and cause problems with your mental health, such as depression, anxiety, psychosis, delirium, and dementia. People with this disorder risk hurting themselves and others. What are the causes? This condition is caused by drinking too much alcohol over time. It is not caused by drinking too much alcohol only one or two times. Some people with this condition drink alcohol to cope with or escape from negative life events. Others drink to relieve pain or symptoms of mental illness. What increases the risk? You are more likely to develop this condition if:  You have a family history of alcohol use disorder.  Your culture encourages drinking to the point of intoxication, or makes alcohol easy to get.  You had a mood or conduct disorder in childhood.  You have been a victim of abuse.  You are an adolescent and: ? You have poor grades or difficulties in school. ? Your caregivers do not talk to you about saying no to alcohol, or supervise your activities. ? You are impulsive or you have trouble with self-control.  What are the signs or symptoms? Symptoms of this condition include:  Drinkingmore than you want to.  Drinking for longer than you want to.  Trying several times to drink less or to control your drinking.  Spending a lot of time getting alcohol, drinking, or recovering from drinking.  Craving alcohol.  Having problems at work, at school, or at home due to drinking.  Having problems in relationships due to drinking.  Drinking when it is dangerous to drink, such as before  driving a car.  Continuing to drink even though you know you might have a physical or mental problem related to drinking.  Needing more and more alcohol to get the same effect you want from the alcohol (building up tolerance).  Having symptoms of withdrawal when you stop drinking. Symptoms of withdrawal include: ? Fatigue. ? Nightmares. ? Trouble sleeping. ? Depression. ? Anxiety. ? Fever. ? Seizures. ? Severe confusion. ? Feeling or seeing things that are not there (hallucinations). ? Tremors. ? Rapid heart rate. ? Rapid breathing. ? High blood pressure.  Drinking to avoid symptoms of withdrawal.  How is this diagnosed? This condition is diagnosed with an assessment. Your health care provider may start the assessment by asking three or four questions about your drinking. Your health care provider may perform a physical exam or do lab tests to see if you have physical problems resulting from alcohol use. She or he may refer you to a mental health professional for evaluation. How is this treated? Some people with alcohol use disorder are able to reduce their alcohol use to low-risk levels. Others need to completely quit drinking alcohol. When necessary, mental health professionals with specialized training in substance use treatment can help. Your health care provider can help you decide how severe your alcohol use disorder is and what type of treatment you need. The following forms of treatment are available:  Detoxification. Detoxification involves quitting drinking and using prescription medicines within the first week to help lessen withdrawal symptoms. This treatment is important for people who have had withdrawal symptoms before and for   heavy drinkers who are likely to have withdrawal symptoms. Alcohol withdrawal can be dangerous, and in severe cases, it can cause death. Detoxification may be provided in a home, community, or primary care setting, or in a hospital or substance use  treatment facility.  Counseling. This treatment is also called talk therapy. It is provided by substance use treatment counselors. A counselor can address the reasons you use alcohol and suggest ways to keep you from drinking again or to prevent problem drinking. The goals of talk therapy are to: ? Find healthy activities and ways for you to cope with stress. ? Identify and avoid the things that trigger your alcohol use. ? Help you learn how to handle cravings.  Medicines.Medicines can help treat alcohol use disorder by: ? Decreasing alcohol cravings. ? Decreasing the positive feeling you have when you drink alcohol. ? Causing an uncomfortable physical reaction when you drink alcohol (aversion therapy).  Support groups. Support groups are led by people who have quit drinking. They provide emotional support, advice, and guidance.  These forms of treatment are often combined. Some people with this condition benefit from a combination of treatments provided by specialized substance use treatment centers. Follow these instructions at home:  Take over-the-counter and prescription medicines only as told by your health care provider.  Check with your health care provider before starting any new medicines.  Ask friends and family members not to offer you alcohol.  Avoid situations where alcohol is served, including gatherings where others are drinking alcohol.  Create a plan for what to do when you are tempted to use alcohol.  Find hobbies or activities that you enjoy that do not include alcohol.  Keep all follow-up visits as told by your health care provider. This is important. How is this prevented?  If you drink, limit alcohol intake to no more than 1 drink a day for nonpregnant women and 2 drinks a day for men. One drink equals 12 oz of beer, 5 oz of wine, or 1 oz of hard liquor.  If you have a mental health condition, get treatment and support.  Do not give alcohol to  adolescents.  If you are an adolescent: ? Do not drink alcohol. ? Do not be afraid to say no if someone offers you alcohol. Speak up about why you do not want to drink. You can be a positive role model for your friends and set a good example for those around you by not drinking alcohol. ? If your friends drink, spend time with others who do not drink alcohol. Make new friends who do not use alcohol. ? Find healthy ways to manage stress and emotions, such as meditation or deep breathing, exercise, spending time in nature, listening to music, or talking with a trusted friend or family member. Contact a health care provider if:  You are not able to take your medicines as told.  Your symptoms get worse.  You return to drinking alcohol (relapse) and your symptoms get worse. Get help right away if:  You have thoughts about hurting yourself or others. If you ever feel like you may hurt yourself or others, or have thoughts about taking your own life, get help right away. You can go to your nearest emergency department or call:  Your local emergency services (911 in the [place] A suicide crisis helInfluenza Virus Vaccine injection (Fluarix) What is this medicine? INFLUENZA VIRUS VACCINE (in floo EN zuh VAHY ruhs vak SEEN) helps to reduce the risk of getting  influenza also known as the flu. This medicine may be used for other purposes; ask your health care provider or pharmacist if you have questions. COMMON BRAND NAME(S): Fluarix, Fluzone What should I tell my health care provider before I take this medicine? They need to know if you have any of these conditions: -bleeding disorder like hemophilia -fever or infection -Guillain-Barre syndrome or other neurological problems -immune system problems -infection with the human immunodeficiency virus (HIV) or AIDS -low blood platelet counts -multiple sclerosis -an unusual or allergic reaction to influenza virus vaccine, eggs, chicken proteins,  latex, gentamicin, other medicines, foods, dyes or preservatives -pregnant or trying to get pregnant -breast-feeding How should I use this medicine? This vaccine is for injection into a muscle. It is given by a health care professional. A copy of Vaccine Information Statements will be given before each vaccination. Read this sheet carefully each time. The sheet may change frequently. Talk to your pediatrician regarding the use of this medicine in children. Special care may be needed. Overdosage: If you think you have taken too much of this medicine contact a poison control center or emergency room at once. NOTE: This medicine is only for you. Do not share this medicine with others. What if I miss a dose? This does not apply. What may interact with this medicine? -chemotherapy or radiation therapy -medicines that lower your immune system like etanercept, anakinra, infliximab, and adalimumab -medicines that treat or prevent blood clots like warfarin -phenytoin -steroid medicines like prednisone or cortisone -theophylline -vaccines This list may not describe all possible interactions. Give your health care provider a list of all the medicines, herbs, non-prescription drugs, or dietary supplements you use. Also tell them if you smoke, drink alcohol, or use illegal drugs. Some items may interact with your medicine. What should I watch for while using this medicine? Report any side effects that do not go away within 3 days to your doctor or health care professional. Call your health care provider if any unusual symptoms occur within 6 weeks of receiving this vaccine. You may still catch the flu, but the illness is not usually as bad. You cannot get the flu from the vaccine. The vaccine will not protect against colds or other illnesses that may cause fever. The vaccine is needed every year. What side effects may I notice from receiving this medicine? Side effects that you should report to your  doctor or health care professional as soon as possible: -allergic reactions like skin rash, itching or hives, swelling of the face, lips, or tongue Side effects that usually do not require medical attention (report to your doctor or health care professional if they continue or are bothersome): -fever -headache -muscle aches and pains -pain, tenderness, redness, or swelling at site where injected -weak or tired This list may not describe all possible side effects. Call your doctor for medical advice about side effects. You may report side effects to FDA at 1-800-FDA-1088. Where should I keep my medicine? This vaccine is only given in a clinic, pharmacy, doctor's office, or other health care setting and will not be stored at home. NOTE: This sheet is a summary. It may not cover all possible information. If you have questions about this medicine, talk to your doctor, pharmacist, or health care provider.  2018 Elsevier/Gold Standard (2008-04-21 09:30:40)  pline, such as the National Suicide Prevention Lifeline at 458-096-6726. This is open 24 hours a day.  Summary  Alcohol use disorder is when your drinking disrupts your daily  life. When you have this condition, you drink too much alcohol and you cannot control your drinking.  Treatment may include detoxification, counseling, medicine, and support groups.  Ask friends and family members not to offer you alcohol. Avoid situations where alcohol is served.  Get help right away if you have thoughts about hurting yourself or others. This information is not intended to replace advice given to you by your health care provider. Make sure you discuss any questions you have with your health care provider. Document Released: 11/01/2004 Document Revised: 06/21/2016 Document Reviewed: 06/21/2016 Elsevier Interactive Patient Education  [employer].

## 2017-07-13 ENCOUNTER — Encounter (HOSPITAL_COMMUNITY): Payer: Self-pay | Admitting: Emergency Medicine

## 2017-07-13 ENCOUNTER — Emergency Department (HOSPITAL_COMMUNITY): Payer: Self-pay

## 2017-07-13 ENCOUNTER — Emergency Department (HOSPITAL_COMMUNITY)
Admission: EM | Admit: 2017-07-13 | Discharge: 2017-07-13 | Disposition: A | Discharge status: Home / Self Care | Payer: Self-pay | Attending: Emergency Medicine | Admitting: Emergency Medicine

## 2017-07-13 DIAGNOSIS — I10 Essential (primary) hypertension: Secondary | ICD-10-CM | POA: Insufficient documentation

## 2017-07-13 DIAGNOSIS — F1010 Alcohol abuse, uncomplicated: Secondary | ICD-10-CM | POA: Insufficient documentation

## 2017-07-13 DIAGNOSIS — R4182 Altered mental status, unspecified: Secondary | ICD-10-CM | POA: Insufficient documentation

## 2017-07-13 DIAGNOSIS — Z041 Encounter for examination and observation following transport accident: Secondary | ICD-10-CM | POA: Insufficient documentation

## 2017-07-13 LAB — COMPREHENSIVE METABOLIC PANEL
ALK PHOS: 60 U/L (ref 38–126)
ALT: 56 U/L (ref 17–63)
AST: 61 U/L — AB (ref 15–41)
Albumin: 3.7 g/dL (ref 3.5–5.0)
Anion gap: 17 — ABNORMAL HIGH (ref 5–15)
BUN: 5 mg/dL — AB (ref 6–20)
CALCIUM: 8.7 mg/dL — AB (ref 8.9–10.3)
CHLORIDE: 105 mmol/L (ref 101–111)
CO2: 19 mmol/L — AB (ref 22–32)
CREATININE: 0.76 mg/dL (ref 0.61–1.24)
GFR calc Af Amer: >60 mL/min (ref >60)
GFR calc non Af Amer: >60 mL/min (ref >60)
Glucose, Bld: 72 mg/dL (ref 65–99)
Potassium: 3.6 mmol/L (ref 3.5–5.1)
SODIUM: 141 mmol/L (ref 135–145)
Total Bilirubin: 0.4 mg/dL (ref 0.3–1.2)
Total Protein: 6.9 g/dL (ref 6.5–8.1)

## 2017-07-13 LAB — CBC
HCT: 43.1 % (ref 39.0–52.0)
Hemoglobin: 14.4 g/dL (ref 13.0–17.0)
MCH: 32.1 pg (ref 26.0–34.0)
MCHC: 33.4 g/dL (ref 30.0–36.0)
MCV: 96.2 fL (ref 78.0–100.0)
PLATELETS: 254 10*3/uL (ref 150–400)
RBC: 4.48 MIL/uL (ref 4.22–5.81)
RDW: 13.5 % (ref 11.5–15.5)
WBC: 10.4 10*3/uL (ref 4.0–10.5)

## 2017-07-13 LAB — CDS SEROLOGY

## 2017-07-13 LAB — I-STAT VENOUS BLOOD GAS, ED
ACID-BASE EXCESS: 3 mmol/L — AB (ref 0.0–2.0)
Bicarbonate: 27.4 mmol/L (ref 20.0–28.0)
O2 Saturation: 100 %
TCO2: 29 mmol/L (ref 22–32)
pCO2, Ven: 39.8 mmHg — ABNORMAL LOW (ref 44.0–60.0)
pH, Ven: 7.446 — ABNORMAL HIGH (ref 7.250–7.430)
pO2, Ven: 178 mmHg — ABNORMAL HIGH (ref 32.0–45.0)

## 2017-07-13 LAB — URINALYSIS, ROUTINE W REFLEX MICROSCOPIC
Bilirubin Urine: NEGATIVE
Glucose, UA: NEGATIVE mg/dL
Hgb urine dipstick: NEGATIVE
Ketones, ur: 5 mg/dL — AB
LEUKOCYTES UA: NEGATIVE
NITRITE: NEGATIVE
Protein, ur: NEGATIVE mg/dL
SPECIFIC GRAVITY, URINE: 1.011 (ref 1.005–1.030)
pH: 6 (ref 5.0–8.0)

## 2017-07-13 LAB — ETHANOL: Alcohol, Ethyl (B): 455 mg/dL (ref <10)

## 2017-07-13 LAB — ACETAMINOPHEN LEVEL: Acetaminophen (Tylenol), Serum: <10 ug/mL — ABNORMAL LOW (ref 10–30)

## 2017-07-13 LAB — I-STAT CHEM 8, ED
BUN: 5 mg/dL — AB (ref 6–20)
CALCIUM ION: 0.96 mmol/L — AB (ref 1.15–1.40)
Chloride: 105 mmol/L (ref 101–111)
Creatinine, Ser: 1.2 mg/dL (ref 0.61–1.24)
GLUCOSE: 76 mg/dL (ref 65–99)
HCT: 45 % (ref 39.0–52.0)
HEMOGLOBIN: 15.3 g/dL (ref 13.0–17.0)
Potassium: 4 mmol/L (ref 3.5–5.1)
SODIUM: 143 mmol/L (ref 135–145)
TCO2: 26 mmol/L (ref 22–32)

## 2017-07-13 LAB — I-STAT CG4 LACTIC ACID, ED
LACTIC ACID, VENOUS: 3.54 mmol/L — AB (ref 0.5–1.9)
Lactic Acid, Venous: 3.52 mmol/L (ref 0.5–1.9)

## 2017-07-13 LAB — PROTIME-INR
INR: 1.02
Prothrombin Time: 13.3 seconds (ref 11.4–15.2)

## 2017-07-13 LAB — CBG MONITORING, ED: Glucose-Capillary: 71 mg/dL (ref 65–99)

## 2017-07-13 LAB — SALICYLATE LEVEL

## 2017-07-13 MED ORDER — SODIUM CHLORIDE 0.9 % IV BOLUS (SEPSIS)
1000.0000 mL | Freq: Once | INTRAVENOUS | Status: AC
  Administered 2017-07-13: 1000 mL via INTRAVENOUS

## 2017-07-13 MED ORDER — HALOPERIDOL LACTATE 5 MG/ML IJ SOLN
2.0000 mg | Freq: Once | INTRAMUSCULAR | Status: DC
  Filled 2017-07-13: qty 1

## 2017-07-13 MED ORDER — IOPAMIDOL (ISOVUE-300) INJECTION 61%
INTRAVENOUS | Status: AC
  Administered 2017-07-13: 100 mL
  Filled 2017-07-13: qty 100

## 2017-07-13 MED ORDER — SODIUM CHLORIDE 0.9 % IV SOLN
INTRAVENOUS | Status: DC
  Administered 2017-07-13: 11:00:00 via INTRAVENOUS
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
## 2017-07-13 NOTE — ED Notes (Signed)
ED Provider at bedside. 
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
## 2017-07-13 NOTE — ED Notes (Signed)
Pt ordered a meal tray for lunch

## 2017-07-13 NOTE — ED Notes (Signed)
Pt dressed himself and removed all leads and IV. Pt reports that he needs to go home and see his wife. Pt is alert to self, situation, time, and place. Pt ambulated in the hallway steady and MD approved discharge.

## 2017-07-13 NOTE — ED Notes (Signed)
Pt returned from CT without issue, Did not give haldol, pt verbally reminded to hold still.

## 2017-07-13 NOTE — ED Notes (Signed)
Etoh 455, dr. Erma Heritage notified.

## 2017-07-13 NOTE — ED Notes (Signed)
Pt received lunch tray 

## 2017-07-14 LAB — SAMPLE TO BLOOD BANK

## 2017-07-14 NOTE — Progress Notes (Signed)
Subjective:  Patient ID: Douglas Browning, male    DOB: 1971-05-03  Age: 46 y.o. MRN: [id number]  CC: Alcohol Problem   HPI Douglas Browning presents for follow up. History of ETOH abuse. Seen previously for BP check for hypertension with clinic RN. When he was experiencing symptoms of possible ETOH withdrawal. Symptoms included elevated and tremors, after d/c alcohol about 3 days prior. He denies current suicidal and homicidal plan or intent. He denies any family history of alcoholism.  He is here for follow up reports decreasing his amount of alcohol use. He reports abstaining from alcohol 2 weeks before having  last drink of alcohol 3 days ago. He reports reducing his drinking to 2 beers. He declines referral to inpatient or outpatient services. He states " I can quit on my own I know I can". He denies any abdominal pain, dysphagia,or melena.. He complains of rhinorrhea he denies any    Outpatient Medications Prior to Visit  Medication Sig Dispense Refill  . amLODipine (NORVASC) 10 MG tablet Take 1 tablet (10 mg total) by mouth daily. 30 tablet 2  . aspirin EC 81 MG tablet Take 1 tablet (81 mg total) by mouth daily. 90 tablet 2  . atorvastatin (LIPITOR) 10 MG tablet Take 1 tablet (10 mg total) by mouth daily. (Patient not taking: Reported on 06/24/2017) 30 tablet 2  . chlordiazePOXIDE (LIBRIUM) 25 MG capsule Take 1 capsule (25 mg total) by mouth 3 (three) times daily as needed for withdrawal (while going through alcohol withdrawal. do not take if drinking alcohol.). 90 capsule 1  . FLUoxetine (PROZAC) 20 MG capsule Take 1 capsule (20 mg total) by mouth daily. 60 capsule 0  . folic acid (FOLVITE) 1 MG tablet Take 1 tablet (1 mg total) by mouth daily. 30 tablet 6  . hydrochlorothiazide (HYDRODIURIL) 25 MG tablet Take 1 tablet (25 mg total) by mouth daily. 30 tablet 2  . ibuprofen (ADVIL,MOTRIN) 200 MG tablet Take 400 mg by mouth every 6 (six) hours as needed for mild pain.    Marland Kitchen labetalol  (NORMODYNE) 300 MG tablet Take 1 tablet (300 mg total) by mouth 2 (two) times daily. 60 tablet 3  . potassium chloride 20 MEQ TBCR Take 20 mEq by mouth daily. 28 tablet 0  . thiamine 100 MG tablet Take 1 tablet (100 mg total) by mouth daily. (Patient not taking: Reported on 06/24/2017) 30 tablet 2   No facility-administered medications prior to visit.     ROS Review of Systems  Constitutional: Negative.   Respiratory: Negative.   Cardiovascular: Negative.   Gastrointestinal: Negative.   Neurological: Negative.   Psychiatric/Behavioral: Positive for behavioral problems (alcoholism ). Negative for suicidal ideas.    Objective:  BP 115/72 (BP Location: Left Arm, Patient Position: Sitting, Cuff Size: Normal)   Pulse 100   Temp 98.1 F (36.7 C) (Oral)   Resp 18   Ht  (1.854 m)   Wt 196 lb (88.9 kg)   SpO2 96%   BMI 25.86 kg/m   BP/Weight 07/11/2017 06/24/2017 06/20/2017  Systolic BP 115 144 143  Diastolic BP 72 83 94  Wt. (Lbs) 196 187.4 -  BMI 25.86 24.72 -    Physical Exam  Constitutional: He is oriented to person, place, and time. He appears well-developed and well-nourished. No distress.  HENT:  Nose: Rhinorrhea present.  Eyes: Pupils are equal, round, and reactive to light. Conjunctivae are normal.  Neck: No JVD present.  Cardiovascular: Normal rate, regular  rhythm, normal heart sounds and intact distal pulses.   Pulmonary/Chest: Effort normal and breath sounds normal.  Abdominal: Soft. Bowel sounds are normal. There is no tenderness.  Neurological: He is alert and oriented to person, place, and time.  Skin: Skin is warm. He is not diaphoretic.  Psychiatric: His mood appears anxious. He is not actively hallucinating. He expresses no homicidal and no suicidal ideation. He expresses no suicidal plans and no homicidal plans.  Nursing note and vitals reviewed.   Assessment & Plan:   1. ETOH abuse Risks of abrupt discontinuation of alcohol use discussed with patient.  Encouraged patient to seek out resources to help him quit alcohol use.  Information given on [facility].   2. Essential hypertension BP controlled on current medications.  3. Rhinorrhea  - fluticasone (FLONASE) 50 MCG/ACT nasal spray; Place 2 sprays into both nostrils daily.  Dispense: 16 g; Refill: 0  4. Needs flu shot  - Flu Vaccine QUAD 6+ mos PF IM (Fluarix Quad PF)    Meds ordered this encounter  Medications  . fluticasone (FLONASE) 50 MCG/ACT nasal spray    Sig: Place 2 sprays into both nostrils daily.    Dispense:  16 g    Refill:  0    Order Specific Question:   Supervising Provider    Answer:   Quentin Angst [[id number]]    Follow-up: Return in about 3 months (around 10/11/2017), or if symptoms worsen or fail to improve, for HTN.    Lizbeth Bark FNP

## 2017-07-15 ENCOUNTER — Encounter: Payer: Self-pay | Admitting: Family Medicine

## 2017-08-19 MED FILL — AMLODIPINE BESYLATE 10 MG T: 10 | 30 days supply | Qty: 30 | Fill #1

## 2017-08-19 MED FILL — LABETALOL HCL 300 MG TABLET: 300 | 30 days supply | Qty: 60 | Fill #1

## 2017-08-19 MED FILL — FOLIC ACID 1 MG TABLET: 1 | 30 days supply | Qty: 30 | Fill #1

## 2017-08-19 MED FILL — FLUoxetine HCL 20 MG CAPS: 20 | 30 days supply | Qty: 30 | Fill #1

## 2017-08-19 MED FILL — HYDROCHLOROTHIAZIDE 25 MG T: 25 | 30 days supply | Qty: 30 | Fill #1

## 2017-08-19 MED FILL — ?ATORVASTATIN 10 MG TABLET: 10 | 30 days supply | Qty: 30 | Fill #1

## 2017-09-03 ENCOUNTER — Ambulatory Visit: Payer: Self-pay

## 2017-09-12 ENCOUNTER — Ambulatory Visit: Payer: Self-pay

## 2017-10-14 ENCOUNTER — Ambulatory Visit: Payer: Self-pay | Admitting: Family Medicine
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
## 2021-12-21 ENCOUNTER — Other Ambulatory Visit: Payer: Self-pay | Admitting: Neurosurgery
